# Patient Record
Sex: Male | Born: 1980 | Race: White | Hispanic: No | Marital: Single | Smoking: Current every day smoker
Health system: Southern US, Community
[De-identification: ages and names within clinical notes are randomized; demographics above are authoritative.]

## PROBLEM LIST (undated history)

## (undated) DIAGNOSIS — R569 Unspecified convulsions: Secondary | ICD-10-CM

## (undated) DIAGNOSIS — E669 Obesity, unspecified: Secondary | ICD-10-CM

## (undated) HISTORY — PX: ELBOW SURGERY: SHX618

## (undated) HISTORY — DX: Obesity, unspecified: E66.9

## (undated) HISTORY — PX: KNEE SURGERY: SHX244

---

## 2008-03-28 ENCOUNTER — Encounter: Admission: RE | Admit: 2008-03-28 | Discharge: 2008-03-28 | Payer: Self-pay | Admitting: Orthopedic Surgery

## 2008-04-02 ENCOUNTER — Ambulatory Visit (HOSPITAL_COMMUNITY): Admission: RE | Admit: 2008-04-02 | Discharge: 2008-04-03 | Payer: Self-pay | Admitting: Orthopedic Surgery

## 2010-02-20 ENCOUNTER — Emergency Department (HOSPITAL_BASED_OUTPATIENT_CLINIC_OR_DEPARTMENT_OTHER): Admission: EM | Admit: 2010-02-20 | Discharge: 2010-02-20 | Payer: Self-pay | Admitting: Emergency Medicine

## 2010-08-18 NOTE — Op Note (Signed)
NAMEMOHAMEDAMIN, NIFONG              ACCOUNT NO.:  [id number]   MEDICAL RECORD NO.:  [id number]          PATIENT TYPE:  AMB   LOCATION:  SDS                          FACILITY:  [facility]   PHYSICIAN:  Dionne Ano. Gramig, M.D.DATE OF BIRTH:  28-Sep-1980   DATE OF PROCEDURE:  04/02/2008  DATE OF DISCHARGE:                               OPERATIVE REPORT   PREOPERATIVE DIAGNOSIS:  Right elbow radial head fracture with lateral  ulnar collateral ligament injury.   POSTOPERATIVE DIAGNOSIS:  Right elbow radial head fracture with lateral  ulnar collateral ligament injury.   PROCEDURES:  1. Open reduction and internal fixation, right radial head fracture.  2. Stress radiography.  3. Lateral ulnar collateral ligament repair, right elbow.   SURGEON:  Dionne Ano. Amanda Pea, MD   ASSISTANT:  Karie Chimera, PA-C   ANESTHESIA:  General.   TOURNIQUET TIME:  Less than 1 hour.   ESTIMATED BLOOD LOSS:  Less than 50 mL.   INDICATIONS FOR PROCEDURE:  This patient is a pleasant 30 year old male  who presents with above-mentioned diagnosis.  His CT scan revealed  significant displacement.  Due to this, we recommended ORIF as he had  what appeared to be a mechanical block on his examination and 2.4-2.5 mm  of displacement on CT scan imaging.  He understood the risks and  benefits, bleeding, infection, heterotopic ossification, and other  risks.  With all issues in mind, he desired to proceed.  All questions  were encouraged and answered preoperatively.   OPERATIVE PROCEDURE:  The patient with Omnicef, anesthesia and taken to  operative suite where he underwent smooth induction of general  anesthesia.  A time-out was called.  Preoperative antibiotics were  given.  He was prepped and draped in the usual sterile fashion.  In the  OR after anesthesia was secured, the arm was evaluated.  He had a slight  bit of crepitance.  At this point in time, I performed a lateral  incision.  Kocher approach was made  between anconeus and the ECU very  carefully.  He did have some disruption of the lateral ulnar collateral  ligament complex.  I removed bloody hematoma fluid from the joint when  arthrotomy was performed and then performed a removal of clotted blood  and interposed synovial tissue.  The capitellum was intact.  There was  no significant scuffing.  The radial head was depressed.  I noted a 45%  depression.  I elevated this nicely with combination of Freer elevator  and following this placed a micro Acutrak screw which achieved excellent  fixation.  Following this, I checked the fixation.  He was stable, had  full arc of motion, and no complicating features.  Final copy of x-rays  were taken.   Following this, I x-rayed the wrist.  He was stable.  There was no  obvious ligamentous or bony derangement, however, one cannot absolutely  rule out an Essex-Lopresti injury.  Following this, the patient then  underwent repair of the lateral ulnar collateral ligament with #2  FiberWire.  He tolerated this well with no complicating features.  Following repair, stress  test looked well.  Final x-rays were taken and  the wound was closed with Vicryl followed by subcuticular Prolene.  Sensorcaine was placed as postop analgesia.  He tolerated the procedure  well with no complicating features.  He will be monitored in the  recovery room and discharged to home after a 23-hour observation if  things go well.  We will plan to begin interval range of motion at 10-14  days with splinting in our therapy department.  In addition to this, we  will not perform any strengthening until 8 weeks postop when the  fracture union is secured.  I have discussed with the __________etc.  All questions have been encouraged and answered.  It was a pleasure to  see him today and participate in his care.  He will remain on 23-hour  observation.  I have discussed all issues with the family.      Dionne Ano. Amanda Pea, M.D.   Electronically Signed     WMG/MEDQ  D:  04/02/2008  T:  04/03/2008  Job:  [id number]

## 2011-01-08 LAB — CBC
Hemoglobin: 15 g/dL (ref 13.0–17.0)
MCHC: 33.6 g/dL (ref 30.0–36.0)
RBC: 4.86 MIL/uL (ref 4.22–5.81)
WBC: 5.7 10*3/uL (ref 4.0–10.5)

## 2012-02-28 ENCOUNTER — Encounter (HOSPITAL_BASED_OUTPATIENT_CLINIC_OR_DEPARTMENT_OTHER): Payer: Self-pay

## 2012-02-28 ENCOUNTER — Emergency Department (HOSPITAL_BASED_OUTPATIENT_CLINIC_OR_DEPARTMENT_OTHER)
Admission: EM | Admit: 2012-02-28 | Discharge: 2012-02-28 | Disposition: A | Discharge status: Home / Self Care | Payer: Self-pay | Attending: Emergency Medicine | Admitting: Emergency Medicine

## 2012-02-28 DIAGNOSIS — Z79899 Other long term (current) drug therapy: Secondary | ICD-10-CM | POA: Insufficient documentation

## 2012-02-28 DIAGNOSIS — J029 Acute pharyngitis, unspecified: Secondary | ICD-10-CM | POA: Insufficient documentation

## 2012-02-28 DIAGNOSIS — R5381 Other malaise: Secondary | ICD-10-CM | POA: Insufficient documentation

## 2012-02-28 DIAGNOSIS — F17200 Nicotine dependence, unspecified, uncomplicated: Secondary | ICD-10-CM | POA: Insufficient documentation

## 2012-02-28 DIAGNOSIS — R5383 Other fatigue: Secondary | ICD-10-CM | POA: Insufficient documentation

## 2012-02-28 DIAGNOSIS — R059 Cough, unspecified: Secondary | ICD-10-CM | POA: Insufficient documentation

## 2012-02-28 DIAGNOSIS — R42 Dizziness and giddiness: Secondary | ICD-10-CM | POA: Insufficient documentation

## 2012-02-28 DIAGNOSIS — G40909 Epilepsy, unspecified, not intractable, without status epilepticus: Secondary | ICD-10-CM | POA: Insufficient documentation

## 2012-02-28 DIAGNOSIS — J3489 Other specified disorders of nose and nasal sinuses: Secondary | ICD-10-CM | POA: Insufficient documentation

## 2012-02-28 DIAGNOSIS — R05 Cough: Secondary | ICD-10-CM | POA: Insufficient documentation

## 2012-02-28 HISTORY — DX: Unspecified convulsions: R56.9

## 2012-02-28 LAB — RAPID STREP SCREEN (MED CTR MEBANE ONLY): Streptococcus, Group A Screen (Direct): NEGATIVE

## 2012-02-28 NOTE — ED Notes (Signed)
Pt reports fever of 102, cough and sore throat x 2 days unrelieved after taking OTC medications.

## 2012-02-28 NOTE — ED Provider Notes (Signed)
History     CSN: [id number]  Arrival date & time 02/28/12  6213   First MD Initiated Contact with Patient 02/28/12 0935      Chief Complaint  Patient presents with  . Fever  . Cough  . Sore Throat    (Consider location/radiation/quality/duration/timing/severity/associated sxs/prior treatment) HPI Comments: Patient presents with a three-day history of fever and sore throat. He had fever for the last 2 days associated with pain on swallowing. He did not have a fever today however. He's had a roommate sick with similar symptoms. He denies any nausea or vomiting. Denies any cough or chest congestion. He has a little bit of rhinorrhea. He denies any shortness of breath. Denies any skin rashes. She's been using over-the-counter medicines with some improvement of symptoms.  Patient is a 31 y.o. male presenting with fever, cough, and pharyngitis.  Fever Primary symptoms of the febrile illness include fever and fatigue. Primary symptoms do not include headaches, cough, shortness of breath, abdominal pain, nausea, vomiting, diarrhea, arthralgias or rash.  Cough Associated symptoms include rhinorrhea and sore throat. Pertinent negatives include no chest pain, no chills, no headaches and no shortness of breath.  Sore Throat Pertinent negatives include no chest pain, no abdominal pain, no headaches and no shortness of breath.    Past Medical History  Diagnosis Date  . Seizures     Past Surgical History  Procedure Date  . Elbow surgery   . Knee surgery     No family history on file.  History  Substance Use Topics  . Smoking status: Current Every Day Smoker -- 0.5 packs/day    Types: Cigarettes  . Smokeless tobacco: Not on file  . Alcohol Use: No      Review of Systems  Constitutional: Positive for fever and fatigue. Negative for chills and diaphoresis.  HENT: Positive for congestion, sore throat and rhinorrhea. Negative for sneezing and trouble swallowing.   Eyes: Negative.     Respiratory: Negative for cough, chest tightness and shortness of breath.   Cardiovascular: Negative for chest pain and leg swelling.  Gastrointestinal: Negative for nausea, vomiting, abdominal pain, diarrhea and blood in stool.  Genitourinary: Negative for frequency, hematuria, flank pain and difficulty urinating.  Musculoskeletal: Negative for back pain and arthralgias.  Skin: Negative for rash.  Neurological: Positive for dizziness. Negative for speech difficulty, weakness, numbness and headaches.    Allergies  Review of patient's allergies indicates no known allergies.  Home Medications   Current Outpatient Rx  Name  Route  Sig  Dispense  Refill  . CARBAMAZEPINE ER 100 MG PO CP12   Oral   Take 100 mg by mouth 2 (two) times daily.           BP 125/72  Pulse 80  Temp 97.9 F (36.6 C) (Oral)  Resp 20  Ht 5\' 10"  (1.778 m)  Wt 235 lb (106.595 kg)  BMI 33.72 kg/m2  SpO2 99%  Physical Exam  Constitutional: He is oriented to person, place, and time. He appears well-developed and well-nourished.  HENT:  Head: Normocephalic and atraumatic.  Eyes: Pupils are equal, round, and reactive to light.  Neck: Normal range of motion. Neck supple.       Mild erythema to the posterior pharynx. No exudates. Uvula is midline. No trismus.  Cardiovascular: Normal rate, regular rhythm and normal heart sounds.   Pulmonary/Chest: Effort normal and breath sounds normal. No respiratory distress. He has no wheezes. He has no rales. He exhibits no tenderness.  Abdominal: Soft. Bowel sounds are normal. There is no tenderness. There is no rebound and no guarding.  Musculoskeletal: Normal range of motion. He exhibits no edema.  Lymphadenopathy:    He has no cervical adenopathy.  Neurological: He is alert and oriented to person, place, and time.  Skin: Skin is warm and dry. No rash noted.  Psychiatric: He has a normal mood and affect.    ED Course  Procedures (including critical care  time)  Results for orders placed during the hospital encounter of 02/28/12  RAPID STREP SCREEN      Component Value Range   Streptococcus, Group A Screen (Direct) NEGATIVE  NEGATIVE   No results found.   No results found.   1. Pharyngitis       MDM  Pt well appearing, likely viral.  Advised symptomatic tx.        Rolan Bucco, MD 02/28/12 [phone]

## 2012-05-29 ENCOUNTER — Encounter (HOSPITAL_BASED_OUTPATIENT_CLINIC_OR_DEPARTMENT_OTHER): Payer: Self-pay | Admitting: *Deleted

## 2012-05-29 ENCOUNTER — Emergency Department (HOSPITAL_BASED_OUTPATIENT_CLINIC_OR_DEPARTMENT_OTHER)
Admission: EM | Admit: 2012-05-29 | Discharge: 2012-05-29 | Disposition: A | Discharge status: Home / Self Care | Payer: Self-pay | Attending: Emergency Medicine | Admitting: Emergency Medicine

## 2012-05-29 DIAGNOSIS — G40909 Epilepsy, unspecified, not intractable, without status epilepticus: Secondary | ICD-10-CM | POA: Insufficient documentation

## 2012-05-29 DIAGNOSIS — Z09 Encounter for follow-up examination after completed treatment for conditions other than malignant neoplasm: Secondary | ICD-10-CM | POA: Insufficient documentation

## 2012-05-29 DIAGNOSIS — F17200 Nicotine dependence, unspecified, uncomplicated: Secondary | ICD-10-CM | POA: Insufficient documentation

## 2012-05-29 DIAGNOSIS — Z79899 Other long term (current) drug therapy: Secondary | ICD-10-CM | POA: Insufficient documentation

## 2012-05-29 NOTE — ED Provider Notes (Signed)
History     CSN: [id number]  Arrival date & time 05/29/12  1520   None     Chief Complaint  Patient presents with  . Wound Check    (Consider location/radiation/quality/duration/timing/severity/associated sxs/prior treatment) HPI Comments: Pt reports he had an I and D a month ago.   Pt is concerned that packing grew over a wound.  No problems.   Just concerned  The history is provided by the patient. No language interpreter was used.    Past Medical History  Diagnosis Date  . Seizures     last seizure Dec 2013    Past Surgical History  Procedure Laterality Date  . Elbow surgery    . Knee surgery      No family history on file.  History  Substance Use Topics  . Smoking status: Current Every Day Smoker -- 0.50 packs/day    Types: Cigarettes  . Smokeless tobacco: Never Used  . Alcohol Use: No      Review of Systems  Gastrointestinal: Negative for abdominal pain.  All other systems reviewed and are negative.    Allergies  Review of patient's allergies indicates no known allergies.  Home Medications   Current Outpatient Rx  Name  Route  Sig  Dispense  Refill  . carbamazepine (CARBATROL) 100 MG 12 hr capsule   Oral   Take 100 mg by mouth 2 (two) times daily.           BP 115/65  Pulse 109  Temp(Src) 98.3 F (36.8 C) (Oral)  Resp 20  SpO2 96%  Physical Exam  Nursing note and vitals reviewed. Constitutional: He appears well-developed and well-nourished.  Abdominal: Soft. There is no tenderness.  Healed incision,   I am unable to palpate any foreign body.      ED Course  Procedures (including critical care time)  Labs Reviewed - No data to display No results found.   No diagnosis found.    MDM  Dr. Rhunette Croft and I looked with ultrasound,  No foreign body,   Pt advised follow up with general surgeon if any problems.   Pt advised he could have foreign body        Lonia Skinner Hopkins, [place] 05/29/12 1700

## 2012-05-29 NOTE — ED Notes (Signed)
Pt reports had abdominal abscess drained ~1 month ago at [facility] ED- states wound has closed over gauze

## 2012-05-30 NOTE — ED Provider Notes (Signed)
Medical screening examination/treatment/procedure(s) were conducted as a shared visit with non-physician practitioner(s) and myself.  I personally evaluated the patient during the encounter  Derwood Kaplan, MD 05/30/12 [phone]

## 2012-11-14 ENCOUNTER — Telehealth: Payer: Self-pay | Admitting: Nurse Practitioner

## 2012-11-16 NOTE — Telephone Encounter (Signed)
Patient was last seen in Sept 2013. He was suppose to get labs to check his blood levels at that time. He did not  And follow up visit was scheduled for March. Pt needs to be seen and have labs drawn.

## 2012-11-20 NOTE — Telephone Encounter (Signed)
I called and made appt for pt with CM/NP //Yan.  Last seen 12/2011.  RV for seizures.  No insurance.   Mom informed (may have $200.00) bill for visit.  Made appt for 11-22-12 at 1100.  She verbalized understanding.

## 2012-11-22 ENCOUNTER — Encounter: Payer: Self-pay | Admitting: Nurse Practitioner

## 2012-11-22 ENCOUNTER — Ambulatory Visit (INDEPENDENT_AMBULATORY_CARE_PROVIDER_SITE_OTHER): Payer: Self-pay | Admitting: Nurse Practitioner

## 2012-11-22 VITALS — BP 118/85 | HR 83 | Ht 70.0 in | Wt 246.0 lb

## 2012-11-22 DIAGNOSIS — G253 Myoclonus: Secondary | ICD-10-CM | POA: Insufficient documentation

## 2012-11-22 DIAGNOSIS — Z79899 Other long term (current) drug therapy: Secondary | ICD-10-CM

## 2012-11-22 DIAGNOSIS — G40309 Generalized idiopathic epilepsy and epileptic syndromes, not intractable, without status epilepticus: Secondary | ICD-10-CM

## 2012-11-22 NOTE — Progress Notes (Signed)
  Reason for visit follow up for seizure disorder HPI: Luis Alexander, 32 year old white male returns for followup. He was last seen in this office 12/14/11.  He he has a history of generalized seizure disorder and is currently on Carbatrol. He claims he  had a  seizures 2 weeks ago at work prior to that in April and went to the ER at [facility]. We do not have those records.  He denies missing any doses of his seizure meds.  He has had poor medical followup in the past. He did not have blood levels drawn after last visit as requested. He  does not have insurance. He has not had recent labs. He is accompanied by his mom today     ROS:  Negative except for 2 seizures since last seen  Medications Current Outpatient Prescriptions on File Prior to Visit  Medication Sig Dispense Refill  . carbamazepine (CARBATROL) 100 MG 12 hr capsule Take 100 mg by mouth 2 (two) times daily.       No current facility-administered medications on file prior to visit.    Allergies No Known Allergies  Physical Exam General: well developed, obese male  seated, in no evident distress   Neurologic Exam Mental Status: Awake and fully alert. Oriented to place and time. Follows all commands. Speech and language normal.   Cranial Nerves:  Pupils equal, briskly reactive to light. Extraocular movements full without nystagmus. Visual fields full to confrontation. Hearing intact and symmetric to finger snap. Facial sensation intact. Face, tongue, palate move normally and symmetrically. Neck flexion and extension normal.  Motor: Normal bulk and tone. Normal strength in all tested extremity muscles.No focal weakness Coordination: Rapid alternating movements normal in all extremities. Finger-to-nose and heel-to-shin performed accurately bilaterally. Gait and Station: Arises from chair without difficulty. Stance is normal. Gait demonstrates normal stride length and balance . Able to heel, toe and tandem walk without  difficulty.  Reflexes: 1+ and symmetric. Toes downgoing.     ASSESSMENT: Generalized seizure disorder with 2 seizures since last seen, one was 2 weeks ago. Patient gets his meds through patient assistance. No recent drug levels have been done     PLAN: Will check labs in the morning, bring your seizure medicine with you and  take after the lab draw, CBC, CMP, CBZ level. Renew meds once labs are back No driving until seizure free  for 6 months  Nilda Riggs, GNP-BC APRN

## 2012-11-22 NOTE — Patient Instructions (Addendum)
Will check labs in the morning, bring your seizure medicine with you and  take after the lab draw Renew meds once labs are back No driving until seizure free  for 6 months

## 2012-11-23 ENCOUNTER — Other Ambulatory Visit: Payer: Self-pay | Admitting: Nurse Practitioner

## 2012-11-27 ENCOUNTER — Telehealth: Payer: Self-pay | Admitting: Nurse Practitioner

## 2012-11-27 NOTE — Telephone Encounter (Signed)
Called patient and relayed cm has not received her lab work back. No one answered I will try another time.

## 2012-11-27 NOTE — Telephone Encounter (Signed)
Please let patient know I have not received his labs from 11/22/12, ordered CBC. CMP, TSH and depakote level

## 2012-11-27 NOTE — Telephone Encounter (Signed)
Please let patient know I have not received his CBC,CMP and CBZ level that was ordered last week

## 2012-11-29 ENCOUNTER — Telehealth: Payer: Self-pay | Admitting: Nurse Practitioner

## 2012-11-29 LAB — CBC WITH DIFFERENTIAL/PLATELET
Basophils Absolute: 0 10*3/uL (ref 0.0–0.2)
Basos: 0 % (ref 0–3)
Eos: 3 % (ref 0–5)
Eosinophils Absolute: 0.2 10*3/uL (ref 0.0–0.4)
HCT: 44.2 % (ref 37.5–51.0)
Hemoglobin: 15 g/dL (ref 12.6–17.7)
Immature Grans (Abs): 0 10*3/uL (ref 0.0–0.1)
Immature Granulocytes: 0 % (ref 0–2)
Lymphocytes Absolute: 2.2 10*3/uL (ref 0.7–3.1)
Lymphs: 33 % (ref 14–46)
MCH: 31.1 pg (ref 26.6–33.0)
MCHC: 33.9 g/dL (ref 31.5–35.7)
MCV: 92 fL (ref 79–97)
Monocytes Absolute: 0.5 10*3/uL (ref 0.1–0.9)
Monocytes: 8 % (ref 4–12)
Neutrophils Absolute: 3.7 10*3/uL (ref 1.4–7.0)
Neutrophils Relative %: 56 % (ref 40–74)
RBC: 4.83 x10E6/uL (ref 4.14–5.80)
RDW: 13.6 % (ref 12.3–15.4)
WBC: 6.6 10*3/uL (ref 3.4–10.8)

## 2012-11-29 LAB — COMPREHENSIVE METABOLIC PANEL
AST: 12 IU/L (ref 0–40)
Albumin/Globulin Ratio: 2.8 — ABNORMAL HIGH (ref 1.1–2.5)
Albumin: 4.7 g/dL (ref 3.5–5.5)
Alkaline Phosphatase: 78 IU/L (ref 39–117)
BUN/Creatinine Ratio: 17 (ref 8–19)
BUN: 14 mg/dL (ref 6–20)
CO2: 27 mmol/L (ref 18–29)
Creatinine, Ser: 0.84 mg/dL (ref 0.76–1.27)
GFR calc non Af Amer: 117 mL/min/{1.73_m2} (ref >59)
Globulin, Total: 1.7 g/dL (ref 1.5–4.5)
Sodium: 141 mmol/L (ref 134–144)

## 2012-11-29 LAB — CARBAMAZEPINE LEVEL, TOTAL

## 2012-11-29 NOTE — Telephone Encounter (Signed)
Done. Andrey Campanile  RN.

## 2012-11-29 NOTE — Telephone Encounter (Signed)
I called pt and relayed the results of labs.  (normal cbc and cmt).  CBZ level needs to be redrawn at the same facility where he had it drawn and there mistake of wrong tube.  Redrawn at no charge.  Trough level needed.  Pt aware.   If they have questions to have them call.

## 2012-12-05 ENCOUNTER — Other Ambulatory Visit: Payer: Self-pay | Admitting: Nurse Practitioner

## 2012-12-05 ENCOUNTER — Other Ambulatory Visit: Payer: Self-pay | Admitting: Neurology

## 2012-12-05 DIAGNOSIS — G40309 Generalized idiopathic epilepsy and epileptic syndromes, not intractable, without status epilepticus: Secondary | ICD-10-CM

## 2012-12-11 LAB — CARBAMAZEPINE LEVEL, TOTAL: Carbamazepine Lvl: 8.9 ug/mL (ref 4.0–12.0)

## 2012-12-12 NOTE — Progress Notes (Signed)
Quick Note:  Spoke to patient's mother and relayed normal labs, per [place]. ______

## 2013-02-12 ENCOUNTER — Telehealth: Payer: Self-pay | Admitting: Neurology

## 2013-02-12 NOTE — Telephone Encounter (Signed)
I called and left Vm. Please provide more information about what pharmacy to send refill to.

## 2013-02-27 ENCOUNTER — Telehealth: Payer: Self-pay

## 2013-02-27 NOTE — Telephone Encounter (Signed)
Mom called, left message saying [facility] needs a Rx for Carbatrol before they will ship meds.  I called them at [phone].  Spoke with Delerda (? on spelling).  She reviewed the patients case and said he was just approved for assistance, temporarily while ins is pending, and they will call today yo verify address for shipment.  [facility] they already had the Rx on file.  No further action is needed at this time.

## 2013-06-13 ENCOUNTER — Other Ambulatory Visit: Payer: Self-pay | Admitting: Neurology

## 2013-06-13 NOTE — Telephone Encounter (Signed)
Pharmacy only requests #[phone].  Patient waiting on mail order

## 2013-06-14 ENCOUNTER — Telehealth: Payer: Self-pay | Admitting: Neurology

## 2013-06-14 NOTE — Telephone Encounter (Signed)
Patient's mom calling to state that patient needs new script written for Carbatrol since he only has a few days left. She says that the script refill needs to be sent to [facility]. If questions, please call patient's mom back.

## 2013-06-14 NOTE — Telephone Encounter (Signed)
I called [facility] [employer].  Spoke with Fraser Din.  She said they already have an application on file which has refills, and they just need [place] to download and fax MD info portion on enrollment form to complete the request.  I went online, downloaded, printed and completed the form.  Faxed completed form to them at [phone].  They will expedite the refill request.

## 2013-09-24 ENCOUNTER — Emergency Department (HOSPITAL_BASED_OUTPATIENT_CLINIC_OR_DEPARTMENT_OTHER): Payer: Self-pay

## 2013-09-24 ENCOUNTER — Telehealth: Payer: Self-pay | Admitting: Neurology

## 2013-09-24 ENCOUNTER — Emergency Department (HOSPITAL_BASED_OUTPATIENT_CLINIC_OR_DEPARTMENT_OTHER)
Admission: EM | Admit: 2013-09-24 | Discharge: 2013-09-24 | Disposition: A | Discharge status: Home / Self Care | Payer: Self-pay | Attending: Emergency Medicine | Admitting: Emergency Medicine

## 2013-09-24 ENCOUNTER — Encounter (HOSPITAL_BASED_OUTPATIENT_CLINIC_OR_DEPARTMENT_OTHER): Payer: Self-pay | Admitting: Emergency Medicine

## 2013-09-24 DIAGNOSIS — R569 Unspecified convulsions: Secondary | ICD-10-CM

## 2013-09-24 DIAGNOSIS — R519 Headache, unspecified: Secondary | ICD-10-CM

## 2013-09-24 DIAGNOSIS — E669 Obesity, unspecified: Secondary | ICD-10-CM | POA: Insufficient documentation

## 2013-09-24 DIAGNOSIS — W2203XA Walked into furniture, initial encounter: Secondary | ICD-10-CM | POA: Insufficient documentation

## 2013-09-24 DIAGNOSIS — Y929 Unspecified place or not applicable: Secondary | ICD-10-CM | POA: Insufficient documentation

## 2013-09-24 DIAGNOSIS — Y9389 Activity, other specified: Secondary | ICD-10-CM | POA: Insufficient documentation

## 2013-09-24 DIAGNOSIS — R110 Nausea: Secondary | ICD-10-CM | POA: Insufficient documentation

## 2013-09-24 DIAGNOSIS — G40909 Epilepsy, unspecified, not intractable, without status epilepticus: Secondary | ICD-10-CM | POA: Insufficient documentation

## 2013-09-24 DIAGNOSIS — IMO0002 Reserved for concepts with insufficient information to code with codable children: Secondary | ICD-10-CM | POA: Insufficient documentation

## 2013-09-24 DIAGNOSIS — R51 Headache: Secondary | ICD-10-CM

## 2013-09-24 DIAGNOSIS — F17200 Nicotine dependence, unspecified, uncomplicated: Secondary | ICD-10-CM | POA: Insufficient documentation

## 2013-09-24 LAB — CARBAMAZEPINE LEVEL, TOTAL: Carbamazepine Lvl: 11 ug/mL (ref 4.0–12.0)

## 2013-09-24 NOTE — ED Provider Notes (Signed)
CSN: [id number]     Arrival date & time 09/24/13  0935 History   First MD Initiated Contact with Patient 09/24/13 0940     Chief Complaint  Patient presents with  . Dizziness     (Consider location/radiation/quality/duration/timing/severity/associated sxs/prior Treatment) Patient is a 33 y.o. male presenting with dizziness.  Dizziness Associated symptoms: headaches and nausea   Associated symptoms: no vomiting    33 year old male with history of seizure disorder, followed as an outpatient by neurology and currently taking carbamazepine, who presents with headache and lightheadedness after having a seizure 4 days ago. Patient states that he had a seizure on Friday, and believes he hit his head on the coffee table. He has had pain in the back of his head since that time, has also had a frontal headache. Has felt foggy headed intermittently, and lightheaded. No vertiginous symptoms. Endorses having some tingling in the back of his head. No distinct numbness tingling or weakness elsewhere in his body that he reports. He did have some soreness of his left arm, which has resolved. Has not had any speech problems. States he has not missed any doses of his carbamazepine. His next urology appointment is in August. Has had some nausea, but no vomiting, no abdominal pain or diarrhea. Does endorse some blurry vision. Has been eating and drinking well. His normal triggers for seizures are being tired or stressed. He states he has a seizure once every several months. He did bite his tongue on Friday, but was not incontinent of urine or feces.  Past Medical History  Diagnosis Date  . Seizures     last seizure Dec 2013  . Obesity    Past Surgical History  Procedure Laterality Date  . Elbow surgery    . Knee surgery     Family History  Problem Relation Age of Onset  . Cancer Father    History  Substance Use Topics  . Smoking status: Current Every Day Smoker -- 0.50 packs/day    Types: Cigarettes   . Smokeless tobacco: Never Used  . Alcohol Use: No    Review of Systems  Constitutional: Negative for fever.  Gastrointestinal: Positive for nausea. Negative for vomiting and abdominal pain.  Neurological: Positive for dizziness, seizures, light-headedness and headaches. Negative for numbness.  All other systems reviewed and are negative.     Allergies  Review of patient's allergies indicates no known allergies.  Home Medications   Prior to Admission medications   Medication Sig Start Date End Date Taking? Authorizing Majesta Leichter  CARBATROL 200 MG 12 hr capsule Take 2 capsules by mouth every morning, and 3 capsules at bedtime 06/13/13   Marcial Pacas, MD   BP 138/79  Pulse 87  Temp(Src) 98 F (36.7 C) (Oral)  Resp 18  Ht 5\' 10"  (1.778 m)  Wt 267 lb 3.2 oz (121.201 kg)  BMI 38.34 kg/m2  SpO2 98% Physical Exam  Constitutional: He is oriented to person, place, and time. He appears well-developed and well-nourished. No distress.  HENT:  Head: Normocephalic.  Mouth/Throat: Oropharynx is clear and moist.  Mild abrasion to posterior mid occiput  Eyes: EOM are normal. Pupils are equal, round, and reactive to light.  Neck: Normal range of motion. Neck supple.  Cardiovascular: Normal rate, regular rhythm and normal heart sounds.   No murmur heard. Pulmonary/Chest: Effort normal and breath sounds normal. No respiratory distress. He has no wheezes. He has no rales.  Abdominal: Soft. Bowel sounds are normal. He exhibits no distension and  no mass. There is no tenderness. There is no rebound and no guarding.  Musculoskeletal: He exhibits no edema.  Lymphadenopathy:    He has no cervical adenopathy.  Neurological: He is alert and oriented to person, place, and time. He has normal strength. He displays no tremor. He displays no seizure activity. Gait normal. GCS eye subscore is 4. GCS verbal subscore is 5. GCS motor subscore is 6.  Face symmetric. EOMI. PERRL. FNF testing normal. Full  strength all four extremities. Decreased sensation to light touch on R face and R arm.  Skin: Skin is warm and dry. No rash noted. He is not diaphoretic. No erythema.  Psychiatric: He has a normal mood and affect. His behavior is normal.    ED Course  Procedures (including critical care time) Labs Review Labs Reviewed  CARBAMAZEPINE LEVEL, TOTAL    Imaging Review Ct Head Wo Contrast  09/24/2013   CLINICAL DATA:  Seizure.  EXAM: CT HEAD WITHOUT CONTRAST  TECHNIQUE: Contiguous axial images were obtained from the base of the skull through the vertex without intravenous contrast.  COMPARISON:  None.  FINDINGS: No mass. No hydrocephalus. No hemorrhage. No acute bony abnormality. Mucosal thickening in the ethmoidal and sphenoid sinuses projection prominent in the right aspect of the sphenoid sinus. Severe streak artifact is present.  IMPRESSION: 1. Sphenoid sinusitis . 2. No acute intracranial abnormality otherwise noted.   Electronically Signed   By: Marcello Moores  Register   On: 09/24/2013 10:32     EKG Interpretation None      MDM   Final diagnoses:  Seizure  Headache, unspecified headache type    33 year old male with known seizure disorder, presenting with headache, lightheadedness, and decreased sensation to light touch over right face and arm in setting of recent seizure with possible head trauma. Will check carbamazepine level and obtain CT head.  Update: Carbamazepine level normal. CT head without any acute findings. Patient is stable for discharge to home. He's been instructed to followup with his neurologist sooner than August, due to his breakthrough seizure. Reiterated to patient importance of not driving.  Chrisandra Netters, MD Family Medicine PGY-2   Leeanne Rio, MD 09/24/13 [phone]

## 2013-09-24 NOTE — Discharge Instructions (Signed)
You were seen today for a headache after a seizure. Your CT scan of your head did not show any problems. Your Carbatrol level was normal. Take tylenol as needed for headache. Follow up with your neurologist within 2-3 weeks due to having had a seizure. Continue your seizure medicine at its current dose.  General Headache Without Cause A general headache is pain or discomfort felt around the head or neck area. The cause may not be found.  HOME CARE   Keep all doctor visits.  Only take medicines as told by your doctor.  Lie down in a dark, quiet room when you have a headache.  Keep a journal to find out if certain things bring on headaches. For example, write down:  What you eat and drink.  How much sleep you get.  Any change to your diet or medicines.  Relax by getting a massage or doing other relaxing activities.  Put ice or heat packs on the head and neck area as told by your doctor.  Lessen stress.  Sit up straight. Do not tighten (tense) your muscles.  Quit smoking if you smoke.  Lessen how much alcohol you drink.  Lessen how much caffeine you drink, or stop drinking caffeine.  Eat and sleep on a regular schedule.  Get 7 to 9 hours of sleep, or as told by your doctor.  Keep lights dim if bright lights bother you or make your headaches worse. GET HELP RIGHT AWAY IF:   Your headache becomes really bad.  You have a fever.  You have a stiff neck.  You have trouble seeing.  Your muscles are weak, or you lose muscle control.  You lose your balance or have trouble walking.  You feel like you will pass out (faint), or you pass out.  You have really bad symptoms that are different than your first symptoms.  You have problems with the medicines given to you by your doctor.  Your medicines do not work.  Your headache feels different than the other headaches.  You feel sick to your stomach (nauseous) or throw up (vomit). MAKE SURE YOU:   Understand these  instructions.  Will watch your condition.  Will get help right away if you are not doing well or get worse. Document Released: 12/30/2007 Document Revised: 06/14/2011 Document Reviewed: 03/12/2011 [facility] Patient Information 2015 [place], [place]. This information is not intended to replace advice given to you by your health care provider. Make sure you discuss any questions you have with your health care provider.

## 2013-09-24 NOTE — Telephone Encounter (Signed)
Patient's mother calling--patient has an appointment in August but had a seizure last Friday and hit his head-patient was seen in the Emergency Room and was told he had a concussion-patient is still having headaches and is dizzy--needs soon appointment--please call.

## 2013-09-24 NOTE — ED Notes (Signed)
Pt reports he had a seizure on Saturday and hit his head on a table and has felt dizzy ever since. Did not call his neurologist.

## 2013-09-25 NOTE — ED Provider Notes (Signed)
I saw and evaluated the patient, reviewed the resident's note and I agree with the findings and plan.   .Face to face Exam:  General:  Awake HEENT:  Atraumatic Resp:  Normal effort Abd:  Nondistended Neuro:No focal weakness  Dot Lanes, MD 09/25/13 1544

## 2013-09-25 NOTE — Telephone Encounter (Signed)
Pt's mother Hilda Blades calling stating that pt had a seizure last Friday and hit his head and went to the ER and was told pt has a concussion. Mother states that pt is still having headaches and dizziness. Pt last OV was 11/22/12 and pt has an yearly appt on 11/22/13. Mother would like for the pt to be seen earlier. Please advise

## 2013-09-25 NOTE — Telephone Encounter (Signed)
Mother calling and hadn't received return call.  Please return call and advise

## 2013-09-25 NOTE — Telephone Encounter (Signed)
Please schedule appt in 2 to 3 weeks

## 2013-09-26 NOTE — Telephone Encounter (Signed)
Patient is scheduled for 09-28-13 with cm. Patient head is still hurting and mom did not want to wait 2-3 weeks.

## 2013-09-28 ENCOUNTER — Ambulatory Visit (INDEPENDENT_AMBULATORY_CARE_PROVIDER_SITE_OTHER): Payer: Self-pay | Admitting: Nurse Practitioner

## 2013-09-28 ENCOUNTER — Encounter: Payer: Self-pay | Admitting: Nurse Practitioner

## 2013-09-28 VITALS — BP 125/84 | HR 81 | Ht 70.0 in | Wt 268.0 lb

## 2013-09-28 DIAGNOSIS — G253 Myoclonus: Secondary | ICD-10-CM

## 2013-09-28 DIAGNOSIS — G40309 Generalized idiopathic epilepsy and epileptic syndromes, not intractable, without status epilepticus: Secondary | ICD-10-CM

## 2013-09-28 DIAGNOSIS — Z5181 Encounter for therapeutic drug level monitoring: Secondary | ICD-10-CM

## 2013-09-28 DIAGNOSIS — R51 Headache: Secondary | ICD-10-CM

## 2013-09-28 DIAGNOSIS — R519 Headache, unspecified: Secondary | ICD-10-CM | POA: Insufficient documentation

## 2013-09-28 MED ORDER — TOPIRAMATE 25 MG PO TABS: ORAL_TABLET | ORAL | Status: DC

## 2013-09-28 NOTE — Patient Instructions (Signed)
Continue Carbatrol at current dose Topamax 25 mg daily for one week then 2 at night  CBC, CMP today Followup in 4 months

## 2013-09-28 NOTE — Progress Notes (Signed)
[facility]  PATIENT: Luis Alexander DOB: [birth year]   REASON FOR VISIT: Followup for seizure disorder.   HISTORY OF PRESENT ILLNESS: Luis Alexander, 33 year old male returns for followup. He has a history of generalized seizure disorder and is currently on Carbatrol. He was last seen in this office 11/22/2012. He was in the emergency room on June 22 after having a seizure and hitting his head. CT of the head was normal. Carbamazepine level was 11. He denies missing any doses of his seizure meds. He has had poor medical followup in the past. He claims he had a seizure 2 months ago did not call our office. He does not have insurance. He has not had recent labs. He is accompanied by his mom today. He gets his Carbatrol through patient assistance. He returns for reevaluation. He is taking Tylenol for headache    REVIEW OF SYSTEMS: Full 14 system review of systems performed and notable only for those listed, all others are neg:  Constitutional: N/A  Cardiovascular: N/A  Ear/Nose/Throat: N/A  Skin: N/A  Eyes: Sensitivity to light  Respiratory: N/A  Gastroitestinal: N/A  Hematology/Lymphatic: N/A  Endocrine: N/A Musculoskeletal:N/A  Allergy/Immunology: N/A  Neurological: Dizziness, headache, seizure  Psychiatric: N/A Sleep : NA   ALLERGIES: No Known Allergies  HOME MEDICATIONS: Outpatient Prescriptions Prior to Visit  Medication Sig Dispense Refill  . CARBATROL 200 MG 12 hr capsule Take 2 capsules by mouth every morning, and 3 capsules at bedtime  20 capsule  2   No facility-administered medications prior to visit.    PAST MEDICAL HISTORY: Past Medical History  Diagnosis Date  . Seizures     last seizure Dec 2013  . Obesity     PAST SURGICAL HISTORY: Past Surgical History  Procedure Laterality Date  . Elbow surgery    . Knee surgery      FAMILY HISTORY: Family History  Problem Relation Age of Onset  . Cancer Father     SOCIAL HISTORY: History    Social History  . Marital Status: Single    Spouse Name: N/A    Number of Children: 0  . Years of Education: 12   Occupational History  . [employer]    Social History Main Topics  . Smoking status: Current Every Day Smoker -- 0.50 packs/day    Types: Cigarettes  . Smokeless tobacco: Never Used  . Alcohol Use: No  . Drug Use: No  . Sexual Activity: Not on file   Other Topics Concern  . Not on file   Social History Narrative   Patient is single with no children   Patient is left handed   Patient has a high school education   Patient drinks 4 cups daily           PHYSICAL EXAM  Filed Vitals:   09/28/13 1527  BP: 125/84  Pulse: 81  Height: 5\' 10"  (1.778 m)  Weight: 268 lb (121.564 kg)   Body mass index is 38.45 kg/(m^2). General: well developed, obese male seated, in no evident distress  Neurologic Exam  Mental Status: Awake and fully alert. Oriented to place and time. Follows all commands. Speech and language normal.  Cranial Nerves: Pupils equal, briskly reactive to light. Extraocular movements full without nystagmus. Visual fields full to confrontation. Hearing intact and symmetric to finger snap. Facial sensation intact. Face, tongue, palate move normally and symmetrically. Neck flexion and extension normal.  Motor: Normal bulk and tone. Normal strength in all tested extremity muscles.No focal  weakness Sensory : Decreased pinprick on the right as compared to the left, soft touch and vibratory normal Coordination: Rapid alternating movements normal in all extremities. Finger-to-nose and heel-to-shin performed accurately bilaterally.  Gait and Station: Arises from chair without difficulty. Stance is normal. Gait demonstrates normal stride length and balance . Able to heel, toe and tandem walk without difficulty.  Reflexes: 1+ and symmetric. Toes downgoing.   DIAGNOSTIC DATA (LABS, IMAGING, TESTING) - I reviewed patient records, labs, notes, testing and imaging myself  where available.  Lab Results  Component Value Date   WBC 6.6 11/23/2012   HGB 15.0 11/23/2012   HCT 44.2 11/23/2012   MCV 92 11/23/2012   PLT 247 04/02/2008      Component Value Date/Time   NA 141 11/23/2012 0000   K 4.7 11/23/2012 0000   CL 102 11/23/2012 0000   CO2 27 11/23/2012 0000   GLUCOSE 93 11/23/2012 0000   BUN 14 11/23/2012 0000   CREATININE 0.84 11/23/2012 0000   CALCIUM 9.8 11/23/2012 0000   PROT 6.4 11/23/2012 0000   AST 12 11/23/2012 0000   ALT 25 11/23/2012 0000   ALKPHOS 78 11/23/2012 0000   BILITOT 0.3 11/23/2012 0000   GFRNONAA 117 11/23/2012 0000   GFRAA 136 11/23/2012 0000       ASSESSMENT AND PLAN  33 y.o. year old male  has a past medical history of Seizures and Obesity. here to followup. Recent seizure and patient hit his head. CT of the head was normal. He has continued to have headache after the seizure. He complains of some light sensitivity. ER record  reviewed  Discussed with Dr. Krista Blue Continue Carbatrol at current dose Topamax 25 mg daily for one week then 2 at night , patient made aware of s/e, no hx of renal stones CBC, CMP today Followup in 4 months, no driving Luis Alexander, [facility], [facility], APRN  [facility] [address], [place], [place] [address] [phone]

## 2013-09-29 LAB — COMPREHENSIVE METABOLIC PANEL
ALK PHOS: 77 IU/L (ref 39–117)
ALT: 26 IU/L (ref 0–44)
AST: 16 IU/L (ref 0–40)
Albumin/Globulin Ratio: 2 (ref 1.1–2.5)
Albumin: 4.8 g/dL (ref 3.5–5.5)
BUN/Creatinine Ratio: 19 (ref 8–19)
BUN: 16 mg/dL (ref 6–20)
CALCIUM: 10.2 mg/dL (ref 8.7–10.2)
CHLORIDE: 101 mmol/L (ref 97–108)
CO2: 26 mmol/L (ref 18–29)
CREATININE: 0.83 mg/dL (ref 0.76–1.27)
GFR calc Af Amer: 136 mL/min/{1.73_m2} (ref >59)
GFR calc non Af Amer: 117 mL/min/{1.73_m2} (ref >59)
Globulin, Total: 2.4 g/dL (ref 1.5–4.5)
Glucose: 86 mg/dL (ref 65–99)
POTASSIUM: 5.1 mmol/L (ref 3.5–5.2)
SODIUM: 142 mmol/L (ref 134–144)
Total Bilirubin: 0.2 mg/dL (ref 0.0–1.2)
Total Protein: 7.2 g/dL (ref 6.0–8.5)

## 2013-09-29 LAB — CBC WITH DIFFERENTIAL/PLATELET
BASOS ABS: 0 10*3/uL (ref 0.0–0.2)
Basos: 0 %
EOS ABS: 0.2 10*3/uL (ref 0.0–0.4)
EOS: 2 %
HCT: 44.3 % (ref 37.5–51.0)
Hemoglobin: 14.9 g/dL (ref 12.6–17.7)
IMMATURE GRANS (ABS): 0 10*3/uL (ref 0.0–0.1)
IMMATURE GRANULOCYTES: 0 %
Lymphocytes Absolute: 2.8 10*3/uL (ref 0.7–3.1)
Lymphs: 28 %
MCH: 31.6 pg (ref 26.6–33.0)
MCHC: 33.6 g/dL (ref 31.5–35.7)
MCV: 94 fL (ref 79–97)
MONOS ABS: 0.9 10*3/uL (ref 0.1–0.9)
Monocytes: 9 %
NEUTROS PCT: 61 %
Neutrophils Absolute: 6 10*3/uL (ref 1.4–7.0)
RBC: 4.72 x10E6/uL (ref 4.14–5.80)
RDW: 12.8 % (ref 12.3–15.4)
WBC: 9.9 10*3/uL (ref 3.4–10.8)

## 2013-10-02 ENCOUNTER — Encounter: Payer: Self-pay | Admitting: *Deleted

## 2013-10-02 NOTE — Progress Notes (Signed)
Quick Note:  Mailed letter with results, since I was not able to contact or leave message thru his phone number. ______

## 2013-10-24 ENCOUNTER — Telehealth: Payer: Self-pay | Admitting: *Deleted

## 2013-10-24 NOTE — Telephone Encounter (Signed)
Called pt and could not leave message because voice mail is not set up.

## 2013-10-25 MED ORDER — TOPIRAMATE 25 MG PO TABS: 50.0000 mg | ORAL_TABLET | Freq: Two times a day (BID) | ORAL | Status: DC

## 2013-10-25 NOTE — Telephone Encounter (Signed)
I called pt and relayed the message below, increase topamax to 50mg  po bid.  New rx sent to [facility] in [place] with new directions.  He asked about being outside in heat  and taking medication.   I relayed stay hydrated.

## 2013-10-25 NOTE — Telephone Encounter (Signed)
Could you please clarify his dose of Topamax. I only ordered to be at 50mg  at night

## 2013-10-25 NOTE — Telephone Encounter (Addendum)
Pt returned call, I spoke with him and he is taking hi carbatrol 400mg  po am and 600mg  po pm, as well as new med, topamax 50mg  po bid for the las 3 wks.  He had seizure (grand mal, similar presentation as other seizures, felt like it was a strong one.  I relayed that we had tried to contact him and hhis VM not set up.  He has new phone and does not know how to set up.  I told him to look for a call after 1600 today, [phone].  He asked if the new med was in his system yet?  I relayed thought that after 3wks would be.

## 2013-10-25 NOTE — Telephone Encounter (Signed)
Yes, this is topamax 50mg  po qhs.

## 2013-10-25 NOTE — Telephone Encounter (Signed)
I attempted to call patient. His voice mail is not set up. He can increase his Topamax by 50mg  so take 50mg  am and 50mg  pm. Please let patient know I called in new Rx to his drug store. Stay on carbatrol as directed.

## 2013-11-06 ENCOUNTER — Encounter (HOSPITAL_BASED_OUTPATIENT_CLINIC_OR_DEPARTMENT_OTHER): Payer: Self-pay | Admitting: Emergency Medicine

## 2013-11-06 ENCOUNTER — Emergency Department (HOSPITAL_BASED_OUTPATIENT_CLINIC_OR_DEPARTMENT_OTHER)
Admission: EM | Admit: 2013-11-06 | Discharge: 2013-11-06 | Disposition: A | Discharge status: Home / Self Care | Payer: Self-pay | Attending: Emergency Medicine | Admitting: Emergency Medicine

## 2013-11-06 DIAGNOSIS — F17200 Nicotine dependence, unspecified, uncomplicated: Secondary | ICD-10-CM | POA: Insufficient documentation

## 2013-11-06 DIAGNOSIS — Z79899 Other long term (current) drug therapy: Secondary | ICD-10-CM | POA: Insufficient documentation

## 2013-11-06 DIAGNOSIS — K3189 Other diseases of stomach and duodenum: Secondary | ICD-10-CM | POA: Insufficient documentation

## 2013-11-06 DIAGNOSIS — L723 Sebaceous cyst: Secondary | ICD-10-CM | POA: Insufficient documentation

## 2013-11-06 DIAGNOSIS — R1013 Epigastric pain: Secondary | ICD-10-CM | POA: Insufficient documentation

## 2013-11-06 DIAGNOSIS — E669 Obesity, unspecified: Secondary | ICD-10-CM | POA: Insufficient documentation

## 2013-11-06 DIAGNOSIS — G40909 Epilepsy, unspecified, not intractable, without status epilepticus: Secondary | ICD-10-CM | POA: Insufficient documentation

## 2013-11-06 LAB — CBC WITH DIFFERENTIAL/PLATELET
BASOS PCT: 0 % (ref 0–1)
Basophils Absolute: 0 10*3/uL (ref 0.0–0.1)
Eosinophils Absolute: 0.2 10*3/uL (ref 0.0–0.7)
Eosinophils Relative: 3 % (ref 0–5)
HCT: 42.7 % (ref 39.0–52.0)
HEMOGLOBIN: 14.4 g/dL (ref 13.0–17.0)
LYMPHS ABS: 2.1 10*3/uL (ref 0.7–4.0)
Lymphocytes Relative: 29 % (ref 12–46)
MCH: 31.8 pg (ref 26.0–34.0)
MCHC: 33.7 g/dL (ref 30.0–36.0)
MCV: 94.3 fL (ref 78.0–100.0)
MONOS PCT: 9 % (ref 3–12)
Monocytes Absolute: 0.7 10*3/uL (ref 0.1–1.0)
NEUTROS ABS: 4.3 10*3/uL (ref 1.7–7.7)
NEUTROS PCT: 59 % (ref 43–77)
PLATELETS: 223 10*3/uL (ref 150–400)
RBC: 4.53 MIL/uL (ref 4.22–5.81)
RDW: 12.6 % (ref 11.5–15.5)
WBC: 7.4 10*3/uL (ref 4.0–10.5)

## 2013-11-06 LAB — COMPREHENSIVE METABOLIC PANEL
ALBUMIN: 4.2 g/dL (ref 3.5–5.2)
ALK PHOS: 79 U/L (ref 39–117)
ALT: 42 U/L (ref 0–53)
ANION GAP: 17 — AB (ref 5–15)
AST: 24 U/L (ref 0–37)
BILIRUBIN TOTAL: 0.3 mg/dL (ref 0.3–1.2)
BUN: 17 mg/dL (ref 6–23)
CHLORIDE: 105 meq/L (ref 96–112)
CO2: 20 mEq/L (ref 19–32)
Calcium: 9.4 mg/dL (ref 8.4–10.5)
Creatinine, Ser: 0.9 mg/dL (ref 0.50–1.35)
GFR calc Af Amer: >90 mL/min (ref >90)
GFR calc non Af Amer: >90 mL/min (ref >90)
GLUCOSE: 107 mg/dL — AB (ref 70–99)
POTASSIUM: 4 meq/L (ref 3.7–5.3)
SODIUM: 142 meq/L (ref 137–147)
Total Protein: 7.2 g/dL (ref 6.0–8.3)

## 2013-11-06 LAB — LIPASE, BLOOD: Lipase: 41 U/L (ref 11–59)

## 2013-11-06 MED ORDER — OMEPRAZOLE 20 MG PO CPDR: DELAYED_RELEASE_CAPSULE | ORAL | Status: DC

## 2013-11-06 MED ORDER — GI COCKTAIL ~~LOC~~
30.0000 mL | Freq: Once | ORAL | Status: AC
  Administered 2013-11-06: 30 mL via ORAL
  Filled 2013-11-06: qty 30

## 2013-11-06 NOTE — ED Notes (Signed)
PA at bedside for evalualtion

## 2013-11-06 NOTE — ED Provider Notes (Signed)
CSN: [id number]     Arrival date & time 11/06/13  1216 History   First MD Initiated Contact with Patient 11/06/13 1220     Chief Complaint  Patient presents with  . Abdominal Pain     (Consider location/radiation/quality/duration/timing/severity/associated sxs/prior Treatment) Patient is a 33 y.o. male presenting with abdominal pain and abscess. The history is provided by the patient. No language interpreter was used.  Abdominal Pain Pain location:  Epigastric Pain quality: sharp   Pain radiates to:  RUQ and LUQ Duration:  2 weeks Associated symptoms: diarrhea   Associated symptoms: no chills, no cough, no fever, no nausea, no shortness of breath and no vomiting   Associated symptoms comment:  Epigastric pain that is sharp, lasting throughout the day, some worse with lying down, not noticeably affected by food. He denies fever. He has noticed some increased belching and reports multiple loose bowel movements that are non-bloody.  Abscess Location:  Torso Abscess quality: not draining   Duration:  6 months Associated symptoms: no fever, no nausea and no vomiting   Associated symptoms comment:  He complains of a knot in the central lower chest wall that he reports has had to be "opened" in the past. Symptoms started reoccurring 6 months ago and have not been associated with redness, significant pain or drainage.    Past Medical History  Diagnosis Date  . Seizures     last seizure Dec 2013  . Obesity    Past Surgical History  Procedure Laterality Date  . Elbow surgery    . Knee surgery     Family History  Problem Relation Age of Onset  . Cancer Father    History  Substance Use Topics  . Smoking status: Current Every Day Smoker -- 0.50 packs/day    Types: Cigarettes  . Smokeless tobacco: Never Used  . Alcohol Use: No    Review of Systems  Constitutional: Negative for fever and chills.  Respiratory: Negative.  Negative for cough and shortness of breath.    Cardiovascular: Negative.   Gastrointestinal: Positive for abdominal pain and diarrhea. Negative for nausea and vomiting.  Musculoskeletal: Negative.   Skin:       See HPI.  Neurological: Negative.       Allergies  Review of patient's allergies indicates no known allergies.  Home Medications   Prior to Admission medications   Medication Sig Start Date End Date Taking? Authorizing Provider  CARBATROL 200 MG 12 hr capsule Take 2 capsules by mouth every morning, and 3 capsules at bedtime 06/13/13   Marcial Pacas, MD  omeprazole (PRILOSEC) 20 MG capsule Take one tablet twice daily for 5 days then once daily after that 11/06/13   Nehemiah Settle A Kennidee Heyne, PA-C  topiramate (TOPAMAX) 25 MG tablet Take 2 tablets (50 mg total) by mouth 2 (two) times daily. 10/25/13   Dennie Bible, NP   BP 123/82  Pulse 72  Temp(Src) 98.8 F (37.1 C) (Oral)  Resp 18  Ht 5\' 10"  (1.778 m)  Wt 250 lb (113.399 kg)  BMI 35.87 kg/m2  SpO2 99% Physical Exam  Constitutional: He is oriented to person, place, and time. He appears well-developed and well-nourished.  HENT:  Head: Normocephalic.  Neck: Normal range of motion. Neck supple.  Cardiovascular: Normal rate and regular rhythm.   Pulmonary/Chest: Effort normal and breath sounds normal.  Abdominal: Soft. Bowel sounds are normal. There is tenderness. There is no rebound and no guarding.  Epigastric tenderness. No RUQ or specific LUQ  tenderness. Non-distended abdomen.   Musculoskeletal: Normal range of motion.  Neurological: He is alert and oriented to person, place, and time.  Skin: Skin is warm and dry. No rash noted.  Small, firm, non-fluctuant cyst over xiphoid process that is minimally tender.   Psychiatric: He has a normal mood and affect.    ED Course  Procedures (including critical care time) Labs Review Labs Reviewed  COMPREHENSIVE METABOLIC PANEL - Abnormal; Notable for the following:    Glucose, Bld 107 (*)    Anion gap 17 (*)    All other  components within normal limits  CBC WITH DIFFERENTIAL  LIPASE, BLOOD    Imaging Review No results found.   EKG Interpretation None      MDM   Final diagnoses:  Epigastric pain  Dyspepsia  Sebaceous cyst    Cyst on lower chest wall present for 6 months and recurrent. No redness or significant tenderness. Suspect sebaceous cyst. No further management required. Abdominal pain likely GI - negative blood studies, some improvement with GI cocktail. No RUQ tenderness to cause suspicion of gall bladder problems. Stable for discharge.     Dewaine Oats, PA-C 11/09/13 0201

## 2013-11-06 NOTE — Discharge Instructions (Signed)
Abdominal Pain Many things can cause abdominal pain. Usually, abdominal pain is not caused by a disease and will improve without treatment. It can often be observed and treated at home. Your health care provider will do a physical exam and possibly order blood tests and X-rays to help determine the seriousness of your pain. However, in many cases, more time must pass before a clear cause of the pain can be found. Before that point, your health care provider may not know if you need more testing or further treatment. HOME CARE INSTRUCTIONS  Monitor your abdominal pain for any changes. The following actions may help to alleviate any discomfort you are experiencing:  Only take over-the-counter or prescription medicines as directed by your health care provider.  Do not take laxatives unless directed to do so by your health care provider.  Try a clear liquid diet (broth, tea, or water) as directed by your health care provider. Slowly move to a bland diet as tolerated. SEEK MEDICAL CARE IF:  You have unexplained abdominal pain.  You have abdominal pain associated with nausea or diarrhea.  You have pain when you urinate or have a bowel movement.  You experience abdominal pain that wakes you in the night.  You have abdominal pain that is worsened or improved by eating food.  You have abdominal pain that is worsened with eating fatty foods.  You have a fever. SEEK IMMEDIATE MEDICAL CARE IF:   Your pain does not go away within 2 hours.  You keep throwing up (vomiting).  Your pain is felt only in portions of the abdomen, such as the right side or the left lower portion of the abdomen.  You pass bloody or black tarry stools. MAKE SURE YOU:  Understand these instructions.   Will watch your condition.   Will get help right away if you are not doing well or get worse.  Document Released: 12/30/2004 Document Revised: 03/27/2013 Document Reviewed: 11/29/2012 [facility] Patient Information  2015 [place], [place]. This information is not intended to replace advice given to you by your health care provider. Make sure you discuss any questions you have with your health care provider. Epidermal Cyst An epidermal cyst is sometimes called a sebaceous cyst, epidermal inclusion cyst, or infundibular cyst. These cysts usually contain a substance that looks "pasty" or "cheesy" and may have a bad smell. This substance is a protein called keratin. Epidermal cysts are usually found on the face, neck, or trunk. They may also occur in the vaginal area or other parts of the genitalia of both men and women. Epidermal cysts are usually small, painless, slow-growing bumps or lumps that move freely under the skin. It is important not to try to pop them. This may cause an infection and lead to tenderness and swelling. CAUSES  Epidermal cysts may be caused by a deep penetrating injury to the skin or a plugged hair follicle, often associated with acne. SYMPTOMS  Epidermal cysts can become inflamed and cause:  Redness.  Tenderness.  Increased temperature of the skin over the bumps or lumps.  Grayish-white, bad smelling material that drains from the bump or lump. DIAGNOSIS  Epidermal cysts are easily diagnosed by your caregiver during an exam. Rarely, a tissue sample (biopsy) may be taken to rule out other conditions that may resemble epidermal cysts. TREATMENT   Epidermal cysts often get better and disappear on their own. They are rarely ever cancerous.  If a cyst becomes infected, it may become inflamed and tender. This may  require opening and draining the cyst. Treatment with antibiotics may be necessary. When the infection is gone, the cyst may be removed with minor surgery.  Small, inflamed cysts can often be treated with antibiotics or by injecting steroid medicines.  Sometimes, epidermal cysts become large and bothersome. If this happens, surgical removal in your caregiver's office may be  necessary. HOME CARE INSTRUCTIONS  Only take over-the-counter or prescription medicines as directed by your caregiver.  Take your antibiotics as directed. Finish them even if you start to feel better. SEEK MEDICAL CARE IF:   Your cyst becomes tender, red, or swollen.  Your condition is not improving or is getting worse.  You have any other questions or concerns. MAKE SURE YOU:  Understand these instructions.  Will watch your condition.  Will get help right away if you are not doing well or get worse. Document Released: 02/21/2004 Document Revised: 06/14/2011 Document Reviewed: 09/28/2010 [facility] Patient Information 2015 [place], [place]. This information is not intended to replace advice given to you by your health care provider. Make sure you discuss any questions you have with your health care provider. Indigestion Indigestion is discomfort in the upper abdomen that is caused by underlying problems such as gastroesophageal reflux disease (GERD), ulcers, or gallbladder problems.  CAUSES  Indigestion can be caused by many things. Possible causes include:  Stomach acid in the esophagus.  Stomach infections, usually caused by the bacteria H. pylori.  Being overweight.  Hiatal hernia. This means part of the stomach pushes up through the diaphragm.  Overeating.  Emotional problems, such as stress, anxiety, or depression.  Poor nutrition.  Consuming too much alcohol, tobacco, or caffeine.  Consuming spicy foods, fats, peppermint, chocolate, tomato products, citrus, or fruit juices.  Medicines such as aspirin and other anti-inflammatory drugs, hormones, steroids, and thyroid medicines.  Gastroparesis. This is a condition in which the stomach does not empty properly.  Stomach cancer.  Pregnancy, due to an increase in hormone levels, a relaxation of muscles in the digestive tract, and pressure on the stomach from the growing fetus. SYMPTOMS   Uncomfortable feeling of  fullness after eating.  Pain or burning sensation in the upper abdomen.  Bloating.  Belching and gas.  Nausea and vomiting.  Acidic taste in the mouth.  Burning sensation in the chest (heartburn). DIAGNOSIS  Your caregiver will review your medical history and perform a physical exam. Other tests, such as blood tests, stool tests, X-rays, and other imaging scans, may be done to check for more serious problems. TREATMENT  Liquid antacids and other drugs may be given to block stomach acid secretion. Medicines that increase esophageal muscle tone may also be given to help reduce symptoms. If an infection is found, antibiotic medicine may be given. HOME CARE INSTRUCTIONS  Avoid foods and drinks that make your symptoms worse, such as:  Caffeine or alcoholic drinks.  Chocolate.  Peppermint or mint flavorings.  Garlic and onions.  Spicy foods.  Citrus fruits, such as oranges, lemons, or limes.  Tomato-based foods such as sauce, chili, salsa, and pizza.  Fried and fatty foods.  Avoid eating for the 3 hours prior to your bedtime.  Eat small, frequent meals instead of large meals.  Stop smoking if you smoke.  Maintain a healthy weight.  Wear loose-fitting clothing. Do not wear anything tight around your waist that causes pressure on your stomach.  Raise the head of your bed 4 to 8 inches with wood blocks to help you sleep. Extra pillows will  not help.  Only take over-the-counter or prescription medicines as directed by your caregiver.  Do not take aspirin, ibuprofen, or other nonsteroidal anti-inflammatory drugs (NSAIDs). SEEK IMMEDIATE MEDICAL CARE IF:   You are not better after 2 days.  You have chest pressure or pain that radiates up into your neck, arms, back, jaw, or upper abdomen.  You have difficulty swallowing.  You keep vomiting.  You have black or bloody stools.  You have a fever.  You have dizziness, fainting, difficulty breathing, or heavy  sweating.  You have severe abdominal pain.  You lose weight without trying. MAKE SURE YOU:  Understand these instructions.  Will watch your condition.  Will get help right away if you are not doing well or get worse. Document Released: 04/29/2004 Document Revised: 06/14/2011 Document Reviewed: 11/04/2010 [facility] Patient Information 2015 [place], [place]. This information is not intended to replace advice given to you by your health care provider. Make sure you discuss any questions you have with your health care provider.

## 2013-11-06 NOTE — ED Notes (Signed)
Pt is concerned about a lump on his upper abdominal area x6 months. He also reports he has been having nausea, diarrhea and stomach pain x2 weeks.

## 2013-11-09 NOTE — ED Provider Notes (Signed)
Medical screening examination/treatment/procedure(s) were performed by non-physician practitioner and as supervising physician I was immediately available for consultation/collaboration.     Veryl Speak, MD 11/09/13 1308

## 2013-11-20 ENCOUNTER — Telehealth: Payer: Self-pay | Admitting: Nurse Practitioner

## 2013-11-20 NOTE — Telephone Encounter (Addendum)
Crystal H with [facility] @ [phone], requesting surgery clearance for patient.  Patient scheduled for upper Endoscopy and Colonoscopy on 9/1.  Please call with any questions or fax clearance to [phone].  Thanks   Letter requested is ready, Please fax over. Dr. Krista Blue

## 2013-11-20 NOTE — Telephone Encounter (Addendum)
Called Crystal back and spoke to her Fine to wait until Dr.Yan return 11-22-2013

## 2013-11-22 ENCOUNTER — Ambulatory Visit: Payer: Self-pay | Admitting: Nurse Practitioner

## 2013-11-23 NOTE — Telephone Encounter (Signed)
I faxed the information to the number that was provided by the patient.

## 2013-12-12 ENCOUNTER — Telehealth: Payer: Self-pay | Admitting: Nurse Practitioner

## 2013-12-12 ENCOUNTER — Telehealth: Payer: Self-pay | Admitting: *Deleted

## 2013-12-12 MED ORDER — CARBATROL 200 MG PO CP12: ORAL_CAPSULE | ORAL | Status: DC

## 2013-12-12 NOTE — Telephone Encounter (Signed)
Rx has been sent to [facility].  I called [facility] (please see previous note).  They are unable to verify the info via phone, and will be sending a fax.  I called patient back on number left in this message.  Got a recording saying the number is temporarily disconnected.  Tried Mobile number on file, got no answer and voicemail had not been set up.  Unable to leave message.

## 2013-12-12 NOTE — Telephone Encounter (Signed)
Mother calling for pt.   He is almost out of  Carbatrol 200mg  caps (400mg  po am, 600mg  po pm).   Will need bridge for about 10 days thru [facility] in [facility] until 90 day supply comes from [facility].  She stated a form was to come to [place] about this.  There phone # [phone], fax [phone].  Call her on cell.  (her vm is not set up).

## 2013-12-12 NOTE — Telephone Encounter (Signed)
Ivin Booty with [facility] Patient Assistant Program @ [phone], faxing Doctor page for assistant program for signature for medication CARBATROL 200 MG 12 hr capsule .  Please call and advise

## 2013-12-12 NOTE — Telephone Encounter (Signed)
Form received, verified and faxed back

## 2013-12-12 NOTE — Telephone Encounter (Signed)
I called back.  Spoke with Clarene Critchley.  She said they will be faxing [place] a page to verify the patient Rx info, which we will need to check the box and/or make any changes if needed and return.  Nothing can be done via phone.

## 2013-12-13 ENCOUNTER — Telehealth: Payer: Self-pay

## 2013-12-13 NOTE — Telephone Encounter (Signed)
[facility] notified us they have extended the Patient Assistance approval on Carbatrol until 12/14/2014.  They have also notified the patient of this decision.  Ref Case # [id number]

## 2014-01-22 ENCOUNTER — Ambulatory Visit (INDEPENDENT_AMBULATORY_CARE_PROVIDER_SITE_OTHER): Payer: Self-pay | Admitting: Nurse Practitioner

## 2014-01-22 ENCOUNTER — Encounter: Payer: Self-pay | Admitting: Nurse Practitioner

## 2014-01-22 VITALS — BP 110/72 | HR 90 | Ht 70.0 in | Wt 266.0 lb

## 2014-01-22 DIAGNOSIS — G8929 Other chronic pain: Secondary | ICD-10-CM

## 2014-01-22 DIAGNOSIS — G40309 Generalized idiopathic epilepsy and epileptic syndromes, not intractable, without status epilepticus: Secondary | ICD-10-CM

## 2014-01-22 DIAGNOSIS — R51 Headache: Secondary | ICD-10-CM

## 2014-01-22 MED ORDER — TOPIRAMATE 25 MG PO TABS: 50.0000 mg | ORAL_TABLET | Freq: Two times a day (BID) | ORAL | Status: DC

## 2014-01-22 NOTE — Progress Notes (Signed)
[facility]  PATIENT: Luis Alexander DOB: [birth year]   REASON FOR VISIT: follow up for seizure disorder   HISTORY OF PRESENT ILLNESS:Luis Alexander, 33 year old male returns for followup. He has a history of generalized seizure disorder and is currently on Carbatrol and Topamax.  He was last seen in this office 09/28/13 and last seizure event was  June 22 after having a seizure and hitting his head. CT of the head was normal. Carbamazepine level was 11. He denies missing any doses of his seizure meds. Topamax was added. He has had poor medical followup in the past.  He does not have insurance. He had recent CBC and CMP which were within normal limits.  He is accompanied by his mom today. He gets his Carbatrol through patient assistance. His Patient assistance is just been renewed for one year He returns for reevaluation.     REVIEW OF SYSTEMS: Full 14 system review of systems performed and notable only for those listed, all others are neg:  Constitutional: N/A  Cardiovascular: N/A  Ear/Nose/Throat: N/A  Skin: N/A  Eyes: N/A  Respiratory: N/A  Gastroitestinal: Recent workup for abdominal pain Hematology/Lymphatic: N/A  Endocrine: N/A Musculoskeletal:N/A  Allergy/Immunology: N/A  Neurological: N/A Psychiatric: N/A Sleep : NA   ALLERGIES: No Known Allergies  HOME MEDICATIONS: Outpatient Prescriptions Prior to Visit  Medication Sig Dispense Refill  . CARBATROL 200 MG 12 hr capsule Take 2 capsules by mouth every morning, and 3 capsules at bedtime  50 capsule  2  . topiramate (TOPAMAX) 25 MG tablet Take 2 tablets (50 mg total) by mouth 2 (two) times daily.  120 tablet  3  . omeprazole (PRILOSEC) 20 MG capsule Take one tablet twice daily for 5 days then once daily after that  30 capsule  0   No facility-administered medications prior to visit.    PAST MEDICAL HISTORY: Past Medical History  Diagnosis Date  . Seizures     last seizure Dec 2013  . Obesity      PAST SURGICAL HISTORY: Past Surgical History  Procedure Laterality Date  . Elbow surgery    . Knee surgery      FAMILY HISTORY: Family History  Problem Relation Age of Onset  . Cancer Father     SOCIAL HISTORY: History   Social History  . Marital Status: Single    Spouse Name: N/A    Number of Children: 0  . Years of Education: 12   Occupational History  . [employer]    Social History Main Topics  . Smoking status: Current Every Day Smoker -- 0.50 packs/day    Types: Cigarettes  . Smokeless tobacco: Never Used  . Alcohol Use: No  . Drug Use: No  . Sexual Activity: Not on file   Other Topics Concern  . Not on file   Social History Narrative   Patient is single with no children   Patient is left handed   Patient has a high school education   Patient drinks 4 cups daily           PHYSICAL EXAM  Filed Vitals:   01/22/14 1536  BP: 110/72  Pulse: 90  Height: 5\' 10"  (1.778 m)  Weight: 266 lb (120.657 kg)   Body mass index is 38.17 kg/(m^2). General: well developed, obese male seated, in no evident distress  Neurologic Exam  Mental Status: Awake and fully alert. Oriented to place and time. Follows all commands. Speech and language normal.  Cranial Nerves: Pupils equal,  briskly reactive to light. Extraocular movements full without nystagmus. Visual fields full to confrontation. Hearing intact and symmetric to finger snap. Facial sensation intact. Face, tongue, palate move normally and symmetrically. Neck flexion and extension normal.  Motor: Normal bulk and tone. Normal strength in all tested extremity muscles.No focal weakness  Sensory : Decreased pinprick on the right as compared to the left, soft touch and vibratory normal  Coordination: Rapid alternating movements normal in all extremities. Finger-to-nose and heel-to-shin performed accurately bilaterally.  Gait and Station: Arises from chair without difficulty. Stance is normal. Gait demonstrates normal  stride length and balance . Able to heel, toe and tandem walk without difficulty.  Reflexes: 1+ and symmetric. Toes downgoing.   DIAGNOSTIC DATA (LABS, IMAGING, TESTING) - I reviewed patient records, labs, notes, testing and imaging myself where available.  Lab Results  Component Value Date   WBC 7.4 11/06/2013   HGB 14.4 11/06/2013   HCT 42.7 11/06/2013   MCV 94.3 11/06/2013   PLT 223 11/06/2013      Component Value Date/Time   NA 142 11/06/2013 1245   NA 142 09/28/2013 1611   K 4.0 11/06/2013 1245   CL 105 11/06/2013 1245   CO2 20 11/06/2013 1245   GLUCOSE 107* 11/06/2013 1245   GLUCOSE 86 09/28/2013 1611   BUN 17 11/06/2013 1245   BUN 16 09/28/2013 1611   CREATININE 0.90 11/06/2013 1245   CALCIUM 9.4 11/06/2013 1245   PROT 7.2 11/06/2013 1245   PROT 7.2 09/28/2013 1611   ALBUMIN 4.2 11/06/2013 1245   AST 24 11/06/2013 1245   ALT 42 11/06/2013 1245   ALKPHOS 79 11/06/2013 1245   BILITOT 0.3 11/06/2013 1245   GFRNONAA >90 11/06/2013 1245   GFRAA >90 11/06/2013 1245      ASSESSMENT AND PLAN  33 y.o. year old male  has a past medical history of Seizures and Obesity. here to followup. Last seizure in 09/24/2013  Continue Carbatrol at current dose refills through pt assistance Continue Topamax at current dose will refill Call for any seizure activity Followup in 6-8 months Dennie Bible, [facility], [facility], [facility] [address] [place], [place] [address] [phone]

## 2014-01-22 NOTE — Patient Instructions (Addendum)
Continue Carbatrol at current dose will refill Continue Topamax at current dose will refill Call for any seizure activity Followup in 6-8 months

## 2014-09-11 ENCOUNTER — Encounter (HOSPITAL_BASED_OUTPATIENT_CLINIC_OR_DEPARTMENT_OTHER): Payer: Self-pay | Admitting: *Deleted

## 2014-09-11 ENCOUNTER — Emergency Department (HOSPITAL_BASED_OUTPATIENT_CLINIC_OR_DEPARTMENT_OTHER)
Admission: EM | Admit: 2014-09-11 | Discharge: 2014-09-11 | Disposition: A | Discharge status: Home / Self Care | Payer: Self-pay | Attending: Emergency Medicine | Admitting: Emergency Medicine

## 2014-09-11 ENCOUNTER — Emergency Department (HOSPITAL_BASED_OUTPATIENT_CLINIC_OR_DEPARTMENT_OTHER): Payer: Self-pay

## 2014-09-11 DIAGNOSIS — R51 Headache: Secondary | ICD-10-CM | POA: Insufficient documentation

## 2014-09-11 DIAGNOSIS — R519 Headache, unspecified: Secondary | ICD-10-CM

## 2014-09-11 DIAGNOSIS — E669 Obesity, unspecified: Secondary | ICD-10-CM | POA: Insufficient documentation

## 2014-09-11 DIAGNOSIS — Z79899 Other long term (current) drug therapy: Secondary | ICD-10-CM | POA: Insufficient documentation

## 2014-09-11 DIAGNOSIS — Z720 Tobacco use: Secondary | ICD-10-CM | POA: Insufficient documentation

## 2014-09-11 DIAGNOSIS — G40409 Other generalized epilepsy and epileptic syndromes, not intractable, without status epilepticus: Secondary | ICD-10-CM | POA: Insufficient documentation

## 2014-09-11 DIAGNOSIS — G40309 Generalized idiopathic epilepsy and epileptic syndromes, not intractable, without status epilepticus: Secondary | ICD-10-CM

## 2014-09-11 DIAGNOSIS — H53149 Visual discomfort, unspecified: Secondary | ICD-10-CM | POA: Insufficient documentation

## 2014-09-11 LAB — CBC WITH DIFFERENTIAL/PLATELET
BASOS ABS: 0 10*3/uL (ref 0.0–0.1)
BASOS PCT: 0 % (ref 0–1)
EOS ABS: 0.2 10*3/uL (ref 0.0–0.7)
EOS PCT: 3 % (ref 0–5)
HCT: 42.6 % (ref 39.0–52.0)
HEMOGLOBIN: 13.9 g/dL (ref 13.0–17.0)
LYMPHS ABS: 2.1 10*3/uL (ref 0.7–4.0)
LYMPHS PCT: 27 % (ref 12–46)
MCH: 31.3 pg (ref 26.0–34.0)
MCHC: 32.6 g/dL (ref 30.0–36.0)
MCV: 95.9 fL (ref 78.0–100.0)
MONO ABS: 0.8 10*3/uL (ref 0.1–1.0)
Monocytes Relative: 10 % (ref 3–12)
NEUTROS ABS: 4.8 10*3/uL (ref 1.7–7.7)
Neutrophils Relative %: 60 % (ref 43–77)
PLATELETS: 244 10*3/uL (ref 150–400)
RBC: 4.44 MIL/uL (ref 4.22–5.81)
RDW: 12.6 % (ref 11.5–15.5)
WBC: 8 10*3/uL (ref 4.0–10.5)

## 2014-09-11 LAB — COMPREHENSIVE METABOLIC PANEL
ALBUMIN: 4.1 g/dL (ref 3.5–5.0)
ALT: 26 U/L (ref 17–63)
AST: 19 U/L (ref 15–41)
Alkaline Phosphatase: 74 U/L (ref 38–126)
Anion gap: 4 — ABNORMAL LOW (ref 5–15)
BUN: 15 mg/dL (ref 6–20)
CHLORIDE: 113 mmol/L — AB (ref 101–111)
CO2: 23 mmol/L (ref 22–32)
CREATININE: 0.8 mg/dL (ref 0.61–1.24)
Calcium: 8.9 mg/dL (ref 8.9–10.3)
GFR calc Af Amer: >60 mL/min (ref >60)
GFR calc non Af Amer: >60 mL/min (ref >60)
GLUCOSE: 95 mg/dL (ref 65–99)
Potassium: 4 mmol/L (ref 3.5–5.1)
SODIUM: 140 mmol/L (ref 135–145)
Total Bilirubin: 0.1 mg/dL — ABNORMAL LOW (ref 0.3–1.2)
Total Protein: 6.6 g/dL (ref 6.5–8.1)

## 2014-09-11 LAB — CARBAMAZEPINE LEVEL, TOTAL: CARBAMAZEPINE LVL: 10.5 ug/mL (ref 4.0–12.0)

## 2014-09-11 MED ORDER — METOCLOPRAMIDE HCL 5 MG/ML IJ SOLN
10.0000 mg | Freq: Once | INTRAMUSCULAR | Status: AC
  Administered 2014-09-11: 10 mg via INTRAVENOUS
  Filled 2014-09-11: qty 2

## 2014-09-11 MED ORDER — DIPHENHYDRAMINE HCL 50 MG/ML IJ SOLN
25.0000 mg | Freq: Once | INTRAMUSCULAR | Status: AC
  Administered 2014-09-11: 25 mg via INTRAVENOUS
  Filled 2014-09-11: qty 1

## 2014-09-11 MED ORDER — SODIUM CHLORIDE 0.9 % IV BOLUS (SEPSIS)
1000.0000 mL | Freq: Once | INTRAVENOUS | Status: AC
  Administered 2014-09-11: 1000 mL via INTRAVENOUS

## 2014-09-11 NOTE — ED Notes (Signed)
MD at bedside. 

## 2014-09-11 NOTE — ED Notes (Signed)
Patient transported to CT 

## 2014-09-11 NOTE — ED Notes (Signed)
Pt reports he had a seizure Monday and has a constant headache since

## 2014-09-11 NOTE — ED Provider Notes (Signed)
CSN: [id number]     Arrival date & time 09/11/14  0906 History   First MD Initiated Contact with Patient 09/11/14 [phone]     Chief Complaint  Patient presents with  . Headache     (Consider location/radiation/quality/duration/timing/severity/associated sxs/prior Treatment) The history is provided by the patient.  Luis Alexander is a 34 y.o. male history obesity, grand mal seizures here presenting with seizure, headaches. States that he had a grand mal seizure 2 days ago. He didn't remember what happened but woke up on the floor. Since then he has been having headaches. States that the headaches were frontal and associated with some blurred vision as well as photophobia. Denies any fevers or chills. Denies any neck pain. Has noticed some left eye twitching afterwards. Has been taking his current [facility] and Topamax as prescribed. Has been followed up with [facility] neurology.    Past Medical History  Diagnosis Date  . Obesity   . Seizures     last seizure 09/09/14   Past Surgical History  Procedure Laterality Date  . Elbow surgery    . Knee surgery     Family History  Problem Relation Age of Onset  . Cancer Father    History  Substance Use Topics  . Smoking status: Current Every Day Smoker -- 0.50 packs/day    Types: Cigarettes  . Smokeless tobacco: Never Used  . Alcohol Use: No    Review of Systems  Neurological: Positive for headaches.  All other systems reviewed and are negative.     Allergies  Review of patient's allergies indicates no known allergies.  Home Medications   Prior to Admission medications   Medication Sig Start Date End Date Taking? Authorizing Provider  CARBATROL 200 MG 12 hr capsule Take 2 capsules by mouth every morning, and 3 capsules at bedtime 12/12/13   Marcial Pacas, MD  topiramate (TOPAMAX) 25 MG tablet Take 2 tablets (50 mg total) by mouth 2 (two) times daily. 01/22/14   Dennie Bible, NP   BP 155/81 mmHg  Pulse 78  Temp(Src) 97.8 F  (36.6 C) (Oral)  Resp 18  Ht 5\' 10"  (1.778 m)  Wt 240 lb (108.863 kg)  BMI 34.44 kg/m2  SpO2 97% Physical Exam  Constitutional: He is oriented to person, place, and time.  Slightly uncomfortable   HENT:  Head: Normocephalic.  Mouth/Throat: Oropharynx is clear and moist.  Eyes: Conjunctivae are normal. Pupils are equal, round, and reactive to light.  Mild photophobia. No eye deviation. Extra ocular movements intact   Neck: Normal range of motion. Neck supple.  Cardiovascular: Normal rate, regular rhythm and normal heart sounds.   Pulmonary/Chest: Effort normal and breath sounds normal. No respiratory distress. He has no wheezes. He has no rales.  Abdominal: Soft. Bowel sounds are normal. He exhibits no distension. There is no tenderness. There is no rebound.  Musculoskeletal: Normal range of motion. He exhibits no edema or tenderness.  Neurological: He is alert and oriented to person, place, and time. No cranial nerve deficit. Coordination normal.  CN 2-12 intact. Nl finger to nose. Nl gait. Nl strength throughout. No pronator drift.   Skin: Skin is warm and dry.  Psychiatric: He has a normal mood and affect. His behavior is normal. Judgment and thought content normal.  Nursing note and vitals reviewed.   ED Course  Procedures (including critical care time) Labs Review Labs Reviewed  COMPREHENSIVE METABOLIC PANEL - Abnormal; Notable for the following:    Chloride 113 (*)  Total Bilirubin 0.1 (*)    Anion gap 4 (*)    All other components within normal limits  CBC WITH DIFFERENTIAL/PLATELET  CARBAMAZEPINE LEVEL, TOTAL    Imaging Review Ct Head Wo Contrast  09/11/2014   CLINICAL DATA:  Headache. Seizure Monday. Frontal headache since the seizure. Initial encounter.  EXAM: CT HEAD WITHOUT CONTRAST  TECHNIQUE: Contiguous axial images were obtained from the base of the skull through the vertex without intravenous contrast.  COMPARISON:  09/24/2013.  FINDINGS: No mass lesion, mass  effect, midline shift, hydrocephalus, hemorrhage. No territorial ischemia or acute infarction. Calvarium intact. Hypoplastic LEFT frontal sinus. Mastoid air cells clear. Mild mucosal thickening in the sphenoid sinuses.  IMPRESSION: Negative CT head.   Electronically Signed   By: Dereck Ligas M.D.   On: 09/11/2014 10:15     EKG Interpretation None      MDM   Final diagnoses:  None    Luis Alexander is a 34 y.o. male here with seizure 2 days ago now has headaches. Consider migraines vs concussion vs bleed. Will give migraine cocktail and get CT head and labs and Carbamezapine level.    11:00 AM Carbamezapine level therapeutic, CT head unremarkable. Labs unremarkable. Headache improved with migraine cocktail. Discussed with Dr. Doy Mince, who recommend that patient call his neurologist regarding follow up and medication adjustment. She states that its not too unusual to have prolonged headaches after seizure. Felt better, will dc home.    Wandra Arthurs, MD 09/11/14 1101

## 2014-09-11 NOTE — ED Notes (Signed)
Pt given note for work- Pt verbalizes understanding of f/u instructions and has a ride home

## 2014-09-11 NOTE — Discharge Instructions (Signed)
Continue taking your seizure medicines.   Do NOT drive until you see neurology.   Take tylenol, motrin for headaches.   Call your neurologist for follow up in a week.   Return to ER if you have severe headaches, seizures.

## 2014-09-12 ENCOUNTER — Ambulatory Visit (INDEPENDENT_AMBULATORY_CARE_PROVIDER_SITE_OTHER): Payer: Self-pay | Admitting: Nurse Practitioner

## 2014-09-12 ENCOUNTER — Encounter: Payer: Self-pay | Admitting: Nurse Practitioner

## 2014-09-12 VITALS — BP 117/80 | HR 84 | Ht 69.5 in | Wt 266.5 lb

## 2014-09-12 DIAGNOSIS — R51 Headache: Secondary | ICD-10-CM

## 2014-09-12 DIAGNOSIS — G253 Myoclonus: Secondary | ICD-10-CM

## 2014-09-12 DIAGNOSIS — G8929 Other chronic pain: Secondary | ICD-10-CM

## 2014-09-12 DIAGNOSIS — G40309 Generalized idiopathic epilepsy and epileptic syndromes, not intractable, without status epilepticus: Secondary | ICD-10-CM

## 2014-09-12 MED ORDER — TOPIRAMATE 25 MG PO TABS: 75.0000 mg | ORAL_TABLET | Freq: Two times a day (BID) | ORAL | Status: DC

## 2014-09-12 NOTE — Patient Instructions (Signed)
Increase Topamax to 75 mg twice daily, will call in new prescription Continue Carbatrol at current dose, gets through patient assistance No standing at heights such as rooftops, trees etc No swimming alone, no tub baths or hot tubs Avoid open fires Please maintain seizure precautions. Do not participate in activities where a loss of awareness could hurt yourself or someone else.  If you have a seizure, according to [place] law, no driving any type of motorized vehicle until seizure free for 6 months and under a physicians care Call for further seizure activity Follow-up in 6 months

## 2014-09-12 NOTE — Progress Notes (Signed)
[facility]  PATIENT: Luis Alexander DOB: 1980/05/12   REASON FOR VISIT: Follow-up for recent seizure and history of headaches  HISTORY FROM: Patient    HISTORY OF PRESENT ILLNESS:Luis Alexander, 34 year old male returns for followup. He was last seen in this office 01/22/2014. He has a history of generalized seizure disorder and is currently on Carbatrol and Topamax. He had a seizure event on 09/09/2014 while at home, found himself lying on floor. He denies any recent infection or  sleep deprivation. He claims he has been taking his medications. He went to the emergency room on 09/11/14 because of continued headache in the frontal area associated with some blurred vision and photophobia. He was given a migraine cocktail which resolved his headache. CT of the head was unremarkable. Carbamazepine level was 10.5   He is also on Topamax 50 mg twice daily He has had poor medical followup in the past. He does not have insurance.  He is accompanied by his mom today. He gets his Carbatrol through patient assistance. He returns for reevaluation.    REVIEW OF SYSTEMS: Full 14 system review of systems performed and notable only for those listed, all others are neg:  Constitutional: neg  Cardiovascular: neg Ear/Nose/Throat: neg  Skin: neg Eyes: Eye pain Respiratory: neg Gastroitestinal: neg  Hematology/Lymphatic: neg  Endocrine: neg Musculoskeletal:neg Allergy/Immunology: neg Neurological: Recent seizure Psychiatric: neg Sleep : neg   ALLERGIES: No Known Allergies  HOME MEDICATIONS: Outpatient Prescriptions Prior to Visit  Medication Sig Dispense Refill  . CARBATROL 200 MG 12 hr capsule Take 2 capsules by mouth every morning, and 3 capsules at bedtime 50 capsule 2  . topiramate (TOPAMAX) 25 MG tablet Take 2 tablets (50 mg total) by mouth 2 (two) times daily. 120 tablet 8   No facility-administered medications prior to visit.    PAST MEDICAL HISTORY: Past Medical  History  Diagnosis Date  . Obesity   . Seizures     last seizure 09/09/14    PAST SURGICAL HISTORY: Past Surgical History  Procedure Laterality Date  . Elbow surgery    . Knee surgery      FAMILY HISTORY: Family History  Problem Relation Age of Onset  . Cancer Father     SOCIAL HISTORY: History   Social History  . Marital Status: Single    Spouse Name: N/A  . Number of Children: 0  . Years of Education: 12   Occupational History  . [employer]    Social History Main Topics  . Smoking status: Current Every Day Smoker -- 2.00 packs/day    Types: Cigarettes  . Smokeless tobacco: Never Used  . Alcohol Use: No  . Drug Use: No  . Sexual Activity: Not on file   Other Topics Concern  . Not on file   Social History Narrative   Patient is single with no children   Patient is left handed   Patient has a high school education   Patient drinks 4 cups daily           PHYSICAL EXAM  Filed Vitals:   09/12/14 1120  BP: 117/80  Pulse: 84  Height: 5' 9.5" (1.765 m)  Weight: 266 lb 8 oz (120.884 kg)   Body mass index is 38.8 kg/(m^2). General: well developed, obese male seated, in no evident distress  Neurologic Exam  Mental Status: Awake and fully alert. Oriented to place and time. Follows all commands. Speech and language normal.  Cranial Nerves: Pupils equal, briskly reactive to light.  Extraocular movements full without nystagmus. Visual fields full to confrontation. Hearing intact and symmetric to finger snap. Facial sensation intact. Face, tongue, palate move normally and symmetrically. Neck flexion and extension normal.  Motor: Normal bulk and tone. Normal strength in all tested extremity muscles.No focal weakness  Coordination: Rapid alternating movements normal in all extremities. Finger-to-nose and heel-to-shin performed accurately bilaterally.  Gait and Station: Arises from chair without difficulty. Stance is normal. Gait demonstrates normal stride length and  balance . Able to heel, toe and tandem walk without difficulty.  Reflexes: 1+ and symmetric. Toes downgoing.   DIAGNOSTIC DATA (LABS, IMAGING, TESTING) - I reviewed patient records, labs, notes, testing and imaging myself where available.  Lab Results  Component Value Date   WBC 8.0 09/11/2014   HGB 13.9 09/11/2014   HCT 42.6 09/11/2014   MCV 95.9 09/11/2014   PLT 244 09/11/2014      Component Value Date/Time   NA 140 09/11/2014 0935   NA 142 09/28/2013 1611   K 4.0 09/11/2014 0935   CL 113* 09/11/2014 0935   CO2 23 09/11/2014 0935   GLUCOSE 95 09/11/2014 0935   GLUCOSE 86 09/28/2013 1611   BUN 15 09/11/2014 0935   BUN 16 09/28/2013 1611   CREATININE 0.80 09/11/2014 0935   CALCIUM 8.9 09/11/2014 0935   PROT 6.6 09/11/2014 0935   PROT 7.2 09/28/2013 1611   ALBUMIN 4.1 09/11/2014 0935   AST 19 09/11/2014 0935   ALT 26 09/11/2014 0935   ALKPHOS 74 09/11/2014 0935   BILITOT 0.1* 09/11/2014 0935   GFRNONAA >60 09/11/2014 0935   GFRAA >60 09/11/2014 0935   ASSESSMENT AND PLAN  34 y.o. year old male  has a past medical history of Obesity and Seizures. here here to follow-up after a recent seizure on 09/09/2014. ER record reviewed He also has a history of headaches.  Increase Topamax to 75 mg twice daily, will call in new prescription Continue Carbatrol at current dose, gets through patient assistance No standing at heights such as rooftops, trees etc No swimming alone, no tub baths or hot tubs Avoid open fires Please maintain seizure precautions. Do not participate in activities where a loss of awareness could hurt yourself or someone else.  If you have a seizure, according to [place] law, no driving any type of motorized vehicle until seizure free for 6 months and under a physicians care Call for further seizure activity Follow-up in 6 months Dennie Bible, [place], [facility], [facility] [address] [place], [place] [address] [phone]

## 2014-09-13 NOTE — Progress Notes (Signed)
I have reviewed and agreed above plan. 

## 2014-09-23 ENCOUNTER — Telehealth: Payer: Self-pay | Admitting: Nurse Practitioner

## 2014-09-23 ENCOUNTER — Ambulatory Visit: Payer: Self-pay | Admitting: Nurse Practitioner

## 2014-09-23 MED ORDER — TOPIRAMATE 25 MG PO TABS: 100.0000 mg | ORAL_TABLET | Freq: Two times a day (BID) | ORAL | Status: DC

## 2014-09-23 NOTE — Telephone Encounter (Signed)
Patients mother called and requested to speak with the nurse regarding her son. She stated that he had another seizure this morning and would like to touch base with our office regarding this. Please call and advise.

## 2014-09-23 NOTE — Telephone Encounter (Signed)
TC to mother. Will increase Topamax to 100mg  BID will send new Rx, since [employer],  check with him about the chemicals used. Strong chemicals can sometime cause a seizure. May need to wear a mask. She is agreeable.

## 2014-09-23 NOTE — Telephone Encounter (Signed)
Spoke to mother of pt.  Pt had another seizure this morning.   Has been taking increased dose of topamax  75mg  po bid as ordered as well as the carbatrol 400mg  am and 600mg  pm.   No obvious triggers states mother other then he is stressed.  (did go over triggers, sleep deprivation, dehydration, over heating, low blood sugar/ skipping meals, alcohol, stress).  Will consult Hoyle Sauer, NP and call her back.

## 2014-09-24 ENCOUNTER — Telehealth: Payer: Self-pay | Admitting: *Deleted

## 2014-09-24 NOTE — Telephone Encounter (Signed)
Mother called back, I had not heard from pt, Luis Alexander.  He had had for 2 wks after seizure 2 wks ago.  Since the last one yesterday, he is having headache again.  Not sure of level, but enough to cause him to try ans sleep to get rid of it.  Would like to know if anything could be called in for him, he does not have insurance so it would be out of pocket.  Please advise.

## 2014-09-24 NOTE — Telephone Encounter (Signed)
Pt has tried tylenol and ibuprofen and nothing has helped.  Would like to go ahead and call in imitrex (generic) for him [facility] at [address].

## 2014-09-24 NOTE — Telephone Encounter (Signed)
I called and LMVM for pt on his home # to call back re: to what is going on with him.

## 2014-09-24 NOTE — Telephone Encounter (Signed)
Patients mother called back and stated that she forgot to tell the nurse that the patient has also been experiencing headaches. Requested that the nurse return her call. Please call and advise.

## 2014-09-25 MED ORDER — PREDNISONE 10 MG PO TABS: 10.0000 mg | ORAL_TABLET | Freq: Every day | ORAL | Status: DC

## 2014-09-25 NOTE — Telephone Encounter (Signed)
Attempted to call mother back, rang and rang then got busy signal. Will try later

## 2014-09-25 NOTE — Telephone Encounter (Signed)
Called mom patient is probably rebounding from OTC tylenol and Ibuprofen. Since his headache has been almost daily will call in Prednisone dose pack for 6 days. Take as directed. After this can use Aleve prn. May call in Imitrex at a later date. She is agreeable to this.

## 2014-11-25 ENCOUNTER — Encounter (HOSPITAL_BASED_OUTPATIENT_CLINIC_OR_DEPARTMENT_OTHER): Payer: Self-pay

## 2014-11-25 ENCOUNTER — Emergency Department (HOSPITAL_BASED_OUTPATIENT_CLINIC_OR_DEPARTMENT_OTHER)
Admission: EM | Admit: 2014-11-25 | Discharge: 2014-11-25 | Disposition: A | Discharge status: Home / Self Care | Payer: Self-pay | Attending: Emergency Medicine | Admitting: Emergency Medicine

## 2014-11-25 DIAGNOSIS — J069 Acute upper respiratory infection, unspecified: Secondary | ICD-10-CM | POA: Insufficient documentation

## 2014-11-25 DIAGNOSIS — Z7952 Long term (current) use of systemic steroids: Secondary | ICD-10-CM | POA: Insufficient documentation

## 2014-11-25 DIAGNOSIS — Z79899 Other long term (current) drug therapy: Secondary | ICD-10-CM | POA: Insufficient documentation

## 2014-11-25 DIAGNOSIS — E669 Obesity, unspecified: Secondary | ICD-10-CM | POA: Insufficient documentation

## 2014-11-25 DIAGNOSIS — Z720 Tobacco use: Secondary | ICD-10-CM | POA: Insufficient documentation

## 2014-11-25 LAB — RAPID STREP SCREEN (MED CTR MEBANE ONLY): Streptococcus, Group A Screen (Direct): NEGATIVE

## 2014-11-25 NOTE — ED Notes (Signed)
C/o head congestion, sore throat, bilat earache-started last night

## 2014-11-25 NOTE — ED Provider Notes (Signed)
CSN: [id number]     Arrival date & time 11/25/14  1837 History   First MD Initiated Contact with Patient 11/25/14 1849     Chief Complaint  Patient presents with  . URI     (Consider location/radiation/quality/duration/timing/severity/associated sxs/prior Treatment) Patient is a 34 y.o. male presenting with URI. The history is provided by the patient. No language interpreter was used.  URI Presenting symptoms: congestion, cough, ear pain and sore throat   Presenting symptoms: no fever   Onset quality:  Sudden Duration:  1 day Timing:  Constant Progression:  Unchanged Chronicity:  New Relieved by:  Nothing Worsened by:  Nothing tried Ineffective treatments:  None tried   Past Medical History  Diagnosis Date  . Obesity   . Seizures     last seizure 09/09/14   Past Surgical History  Procedure Laterality Date  . Elbow surgery    . Knee surgery     Family History  Problem Relation Age of Onset  . Cancer Father    Social History  Substance Use Topics  . Smoking status: Current Every Day Smoker -- 2.00 packs/day    Types: Cigarettes  . Smokeless tobacco: Never Used  . Alcohol Use: No    Review of Systems  Constitutional: Negative for fever.  HENT: Positive for congestion, ear pain and sore throat.   Respiratory: Positive for cough.   All other systems reviewed and are negative.     Allergies  Review of patient's allergies indicates no known allergies.  Home Medications   Prior to Admission medications   Medication Sig Start Date End Date Taking? Authorizing Provider  CARBATROL 200 MG 12 hr capsule Take 2 capsules by mouth every morning, and 3 capsules at bedtime 12/12/13   Marcial Pacas, MD  predniSONE (DELTASONE) 10 MG tablet Take 1 tablet (10 mg total) by mouth daily with breakfast. 6 day dose pack 09/25/14   Dennie Bible, NP  topiramate (TOPAMAX) 25 MG tablet Take 4 tablets (100 mg total) by mouth 2 (two) times daily. 09/23/14   Dennie Bible, NP    BP 132/85 mmHg  Pulse 97  Temp(Src) 98.5 F (36.9 C) (Oral)  Resp 16  Ht 5\' 8"  (1.727 m)  Wt 266 lb (120.657 kg)  BMI 40.45 kg/m2  SpO2 100% Physical Exam  Constitutional: He is oriented to person, place, and time. He appears well-developed and well-nourished.  HENT:  Right Ear: External ear normal.  Left Ear: External ear normal.  Nose: Rhinorrhea present.  Mouth/Throat: Posterior oropharyngeal erythema present.  Neck: Normal range of motion. Neck supple.  Cardiovascular: Normal rate and regular rhythm.   Pulmonary/Chest: Effort normal and breath sounds normal.  Musculoskeletal: Normal range of motion.  Neurological: He is alert and oriented to person, place, and time.  Nursing note and vitals reviewed.   ED Course  Procedures (including critical care time) Labs Review Labs Reviewed  RAPID STREP SCREEN (NOT AT [facility])  CULTURE, GROUP A STREP    Imaging Review No results found. I have personally reviewed and evaluated these images and lab results as part of my medical decision-making.   EKG Interpretation None      MDM   Final diagnoses:  URI (upper respiratory infection)    Strep negative. Pt can do otc medications at home    Glendell Docker, NP 11/25/14 1911  Davonna Belling, MD 11/25/14 2257

## 2014-11-25 NOTE — Discharge Instructions (Signed)
Upper Respiratory Infection, Adult An upper respiratory infection (URI) is also sometimes known as the common cold. The upper respiratory tract includes the nose, sinuses, throat, trachea, and bronchi. Bronchi are the airways leading to the lungs. Most people improve within 1 week, but symptoms can last up to 2 weeks. A residual cough may last even longer.  CAUSES Many different viruses can infect the tissues lining the upper respiratory tract. The tissues become irritated and inflamed and often become very moist. Mucus production is also common. A cold is contagious. You can easily spread the virus to others by oral contact. This includes kissing, sharing a glass, coughing, or sneezing. Touching your mouth or nose and then touching a surface, which is then touched by another person, can also spread the virus. SYMPTOMS  Symptoms typically develop 1 to 3 days after you come in contact with a cold virus. Symptoms vary from person to person. They may include:  Runny nose.  Sneezing.  Nasal congestion.  Sinus irritation.  Sore throat.  Loss of voice (laryngitis).  Cough.  Fatigue.  Muscle aches.  Loss of appetite.  Headache.  Low-grade fever. DIAGNOSIS  You might diagnose your own cold based on familiar symptoms, since most people get a cold 2 to 3 times a year. Your caregiver can confirm this based on your exam. Most importantly, your caregiver can check that your symptoms are not due to another disease such as strep throat, sinusitis, pneumonia, asthma, or epiglottitis. Blood tests, throat tests, and X-rays are not necessary to diagnose a common cold, but they may sometimes be helpful in excluding other more serious diseases. Your caregiver will decide if any further tests are required. RISKS AND COMPLICATIONS  You may be at risk for a more severe case of the common cold if you smoke cigarettes, have chronic heart disease (such as heart failure) or lung disease (such as asthma), or if  you have a weakened immune system. The very young and very old are also at risk for more serious infections. Bacterial sinusitis, middle ear infections, and bacterial pneumonia can complicate the common cold. The common cold can worsen asthma and chronic obstructive pulmonary disease (COPD). Sometimes, these complications can require emergency medical care and may be life-threatening. PREVENTION  The best way to protect against getting a cold is to practice good hygiene. Avoid oral or hand contact with people with cold symptoms. Wash your hands often if contact occurs. There is no clear evidence that vitamin C, vitamin E, echinacea, or exercise reduces the chance of developing a cold. However, it is always recommended to get plenty of rest and practice good nutrition. TREATMENT  Treatment is directed at relieving symptoms. There is no cure. Antibiotics are not effective, because the infection is caused by a virus, not by bacteria. Treatment may include:  Increased fluid intake. Sports drinks offer valuable electrolytes, sugars, and fluids.  Breathing heated mist or steam (vaporizer or shower).  Eating chicken soup or other clear broths, and maintaining good nutrition.  Getting plenty of rest.  Using gargles or lozenges for comfort.  Controlling fevers with ibuprofen or acetaminophen as directed by your caregiver.  Increasing usage of your inhaler if you have asthma. Zinc gel and zinc lozenges, taken in the first 24 hours of the common cold, can shorten the duration and lessen the severity of symptoms. Pain medicines may help with fever, muscle aches, and throat pain. A variety of non-prescription medicines are available to treat congestion and runny nose. Your caregiver   can make recommendations and may suggest nasal or lung inhalers for other symptoms.  HOME CARE INSTRUCTIONS   Only take over-the-counter or prescription medicines for pain, discomfort, or fever as directed by your  caregiver.  Use a warm mist humidifier or inhale steam from a shower to increase air moisture. This may keep secretions moist and make it easier to breathe.  Drink enough water and fluids to keep your urine clear or pale yellow.  Rest as needed.  Return to work when your temperature has returned to normal or as your caregiver advises. You may need to stay home longer to avoid infecting others. You can also use a face mask and careful hand washing to prevent spread of the virus. SEEK MEDICAL CARE IF:   After the first few days, you feel you are getting worse rather than better.  You need your caregiver's advice about medicines to control symptoms.  You develop chills, worsening shortness of breath, or brown or red sputum. These may be signs of pneumonia.  You develop yellow or brown nasal discharge or pain in the face, especially when you bend forward. These may be signs of sinusitis.  You develop a fever, swollen neck glands, pain with swallowing, or white areas in the back of your throat. These may be signs of strep throat. SEEK IMMEDIATE MEDICAL CARE IF:   You have a fever.  You develop severe or persistent headache, ear pain, sinus pain, or chest pain.  You develop wheezing, a prolonged cough, cough up blood, or have a change in your usual mucus (if you have chronic lung disease).  You develop sore muscles or a stiff neck. Document Released: 09/15/2000 Document Revised: 06/14/2011 Document Reviewed: 06/27/2013 ExitCare Patient Information 2015 [employer]. This information is not intended to replace advice given to you by your health care provider. Make sure you discuss any questions you have with your health care provider.  

## 2014-11-28 LAB — CULTURE, GROUP A STREP: Strep A Culture: NEGATIVE

## 2015-03-16 ENCOUNTER — Emergency Department (HOSPITAL_BASED_OUTPATIENT_CLINIC_OR_DEPARTMENT_OTHER)
Admission: EM | Admit: 2015-03-16 | Discharge: 2015-03-16 | Disposition: A | Discharge status: Home / Self Care | Payer: Self-pay | Attending: Emergency Medicine | Admitting: Emergency Medicine

## 2015-03-16 ENCOUNTER — Encounter (HOSPITAL_BASED_OUTPATIENT_CLINIC_OR_DEPARTMENT_OTHER): Payer: Self-pay | Admitting: *Deleted

## 2015-03-16 DIAGNOSIS — E669 Obesity, unspecified: Secondary | ICD-10-CM | POA: Insufficient documentation

## 2015-03-16 DIAGNOSIS — L0201 Cutaneous abscess of face: Secondary | ICD-10-CM | POA: Insufficient documentation

## 2015-03-16 DIAGNOSIS — Z79899 Other long term (current) drug therapy: Secondary | ICD-10-CM | POA: Insufficient documentation

## 2015-03-16 DIAGNOSIS — F17210 Nicotine dependence, cigarettes, uncomplicated: Secondary | ICD-10-CM | POA: Insufficient documentation

## 2015-03-16 MED ORDER — HYDROCODONE-ACETAMINOPHEN 5-325 MG PO TABS: 1.0000 | ORAL_TABLET | ORAL | Status: DC | PRN

## 2015-03-16 MED ORDER — DOXYCYCLINE HYCLATE 100 MG PO TABS
100.0000 mg | ORAL_TABLET | Freq: Once | ORAL | Status: AC
  Administered 2015-03-16: 100 mg via ORAL
  Filled 2015-03-16: qty 1

## 2015-03-16 MED ORDER — NAPROXEN 500 MG PO TABS: 500.0000 mg | ORAL_TABLET | Freq: Two times a day (BID) | ORAL | Status: DC

## 2015-03-16 MED ORDER — DOXYCYCLINE HYCLATE 100 MG PO TABS: 100.0000 mg | ORAL_TABLET | Freq: Two times a day (BID) | ORAL | Status: DC

## 2015-03-16 MED ORDER — LIDOCAINE HCL (PF) 1 % IJ SOLN
5.0000 mL | Freq: Once | INTRAMUSCULAR | Status: AC
  Administered 2015-03-16: 5 mL via INTRADERMAL
  Filled 2015-03-16: qty 5

## 2015-03-16 MED ORDER — HYDROCODONE-ACETAMINOPHEN 5-325 MG PO TABS
1.0000 | ORAL_TABLET | ORAL | Status: AC
  Administered 2015-03-16: 1 via ORAL
  Filled 2015-03-16: qty 1

## 2015-03-16 NOTE — ED Provider Notes (Signed)
CSN: [id number]     Arrival date & time 03/16/15  1953 History  By signing my name below, I, Luis Alexander, attest that this documentation has been prepared under the direction and in the presence of Dorie Rank, MD. Electronically Signed: [name], ED Scribe. 03/16/2015. 8:18 PM.    Chief Complaint  Patient presents with  . Recurrent Skin Infections      The history is provided by the patient. No language interpreter was used.   HPI Comments: Luis Alexander is a 34 y.o. male who presents to the Emergency Department complaining of a constant, worsening abscess to the left lateral aspect of the mouth that has been present for four days. Pt reports he feels that the entire left side of his face is swollen. Pt states he has never had a problem like this before, and denies taking any medication to treat his symptoms.   Past Medical History  Diagnosis Date  . Obesity   . Seizures ([place])     last seizure 09/09/14   Past Surgical History  Procedure Laterality Date  . Elbow surgery    . Knee surgery     Family History  Problem Relation Age of Onset  . Cancer Father    Social History  Substance Use Topics  . Smoking status: Current Every Day Smoker -- 2.00 packs/day    Types: Cigarettes  . Smokeless tobacco: Never Used  . Alcohol Use: 0.0 oz/week    0 Standard drinks or equivalent per week    Review of Systems  A complete 10 system review of systems was obtained and all systems are negative except as noted in the HPI and PMH.    Allergies  Review of patient's allergies indicates no known allergies.  Home Medications   Prior to Admission medications   Medication Sig Start Date End Date Taking? Authorizing Provider  CARBATROL 200 MG 12 hr capsule Take 2 capsules by mouth every morning, and 3 capsules at bedtime 12/12/13  Yes Marcial Pacas, MD  predniSONE (DELTASONE) 10 MG tablet Take 1 tablet (10 mg total) by mouth daily with breakfast. 6 day dose pack 09/25/14  Yes Dennie Bible, NP  topiramate (TOPAMAX) 25 MG tablet Take 4 tablets (100 mg total) by mouth 2 (two) times daily. 09/23/14  Yes Dennie Bible, NP  doxycycline (VIBRA-TABS) 100 MG tablet Take 1 tablet (100 mg total) by mouth 2 (two) times daily. 03/16/15   Dorie Rank, MD  HYDROcodone-acetaminophen (NORCO/VICODIN) 5-325 MG tablet Take 1 tablet by mouth every 4 (four) hours as needed for moderate pain. 03/16/15   Dorie Rank, MD  naproxen (NAPROSYN) 500 MG tablet Take 1 tablet (500 mg total) by mouth 2 (two) times daily with a meal. As needed for pain 03/16/15   Dorie Rank, MD   BP 122/75 mmHg  Pulse 81  Temp(Src) 97.5 F (36.4 C) (Oral)  Resp 20  Ht 5\' 9"  (1.753 m)  Wt 102.059 kg  BMI 33.21 kg/m2  SpO2 100% Physical Exam  Constitutional: He appears well-developed and well-nourished. No distress.  HENT:  Head: Normocephalic and atraumatic.  Right Ear: External ear normal.  Left Ear: External ear normal.  1 cm raised indurated abscess to lateral aspect of mouth on left side No mucosal lesions No dental abnormality   Eyes: Conjunctivae are normal. Right eye exhibits no discharge. Left eye exhibits no discharge. No scleral icterus.  Neck: Neck supple. No tracheal deviation present.  Cardiovascular: Normal rate.   Pulmonary/Chest: Effort  normal. No stridor. No respiratory distress.  Musculoskeletal: He exhibits no edema.  Neurological: He is alert. Cranial nerve deficit: no gross deficits.  Skin: Skin is warm and dry. No rash noted.  Psychiatric: He has a normal mood and affect.  Nursing note and vitals reviewed.   ED Course  .Marland KitchenIncision and Drainage Date/Time: 03/16/2015 8:57 PM Performed by: Dorie Rank Authorized by: Dorie Rank Consent: Verbal consent obtained. Risks and benefits: risks, benefits and alternatives were discussed Consent given by: patient Type: abscess Body area: head Location details: face Anesthesia: local infiltration Local anesthetic: lidocaine 1% without  epinephrine Anesthetic total: 1 ml Patient sedated: no Scalpel size: 11 Incision type: single straight Complexity: simple Drainage: purulent Drainage amount: scant Wound treatment: wound left open Patient tolerance: Patient tolerated the procedure well with no immediate complications Comments: Wound irrigated with normal saline.     (including critical care time)  DIAGNOSTIC STUDIES: Oxygen Saturation is 100% on room air, normal by my interpretation.    COORDINATION OF CARE: 8:06 PM Discussed treatment plan with pt at bedside and pt agreed to plan.    MDM   Final diagnoses:  Facial abscess    DC home with pain meds and doxy.  Warm compresses.  Follow up with PCP in a few days for recheck I personally performed the services described in this documentation, which was scribed in my presence.  The recorded information has been reviewed and is accurate.   Dorie Rank, MD 03/16/15 [phone]

## 2015-03-16 NOTE — Discharge Instructions (Signed)

## 2015-03-16 NOTE — ED Notes (Signed)
Boil on lt lower cheek appeared on Wednesday, increased in size.

## 2015-03-17 ENCOUNTER — Ambulatory Visit: Payer: Self-pay | Admitting: Nurse Practitioner

## 2015-04-26 IMAGING — CT CT HEAD W/O CM
1 of 2 series · 16 of 30 positions shown, 20 images · non-contrast
Comparison: None.

CLINICAL DATA: Seizure.

EXAM:
CT HEAD WITHOUT CONTRAST
TECHNIQUE: Contiguous axial images were obtained from the base of the skull
through the vertex without intravenous contrast.

[Series 2: head 4.8 h37s · axial · 0.42mm/px · z∈[+1372,+1509]mm · 16 of 32 slices shown, 20 images]
[im 2/32  brain]
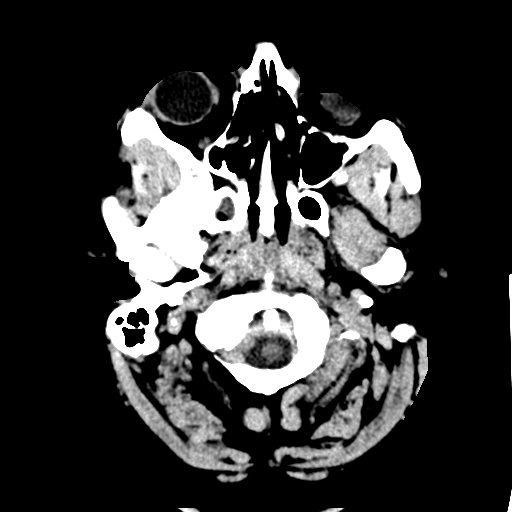
[im 2/32  bone]
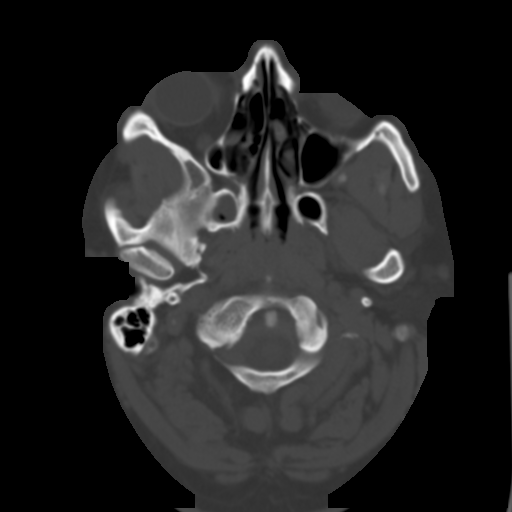
[im 3/32  brain]
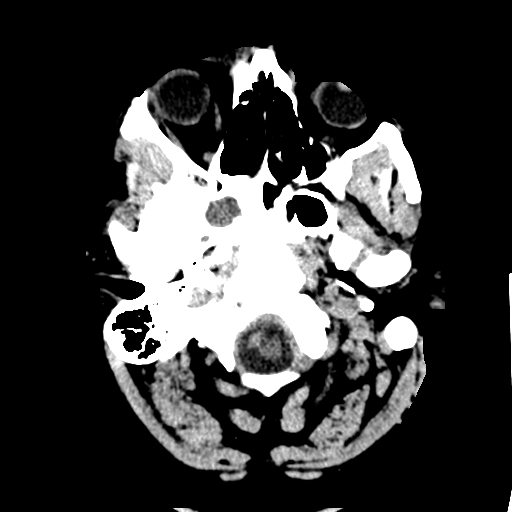
[im 6/32  brain]
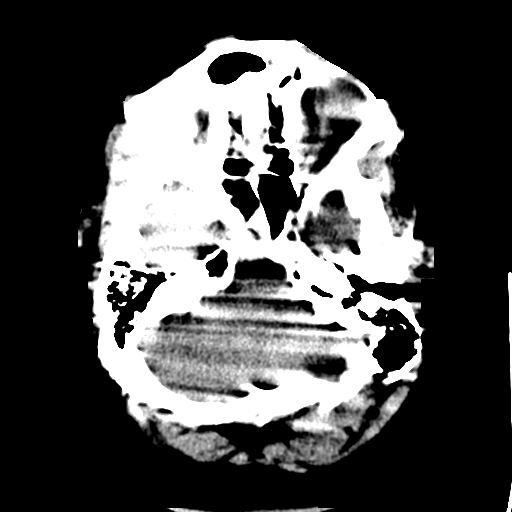
[im 8/32  brain]
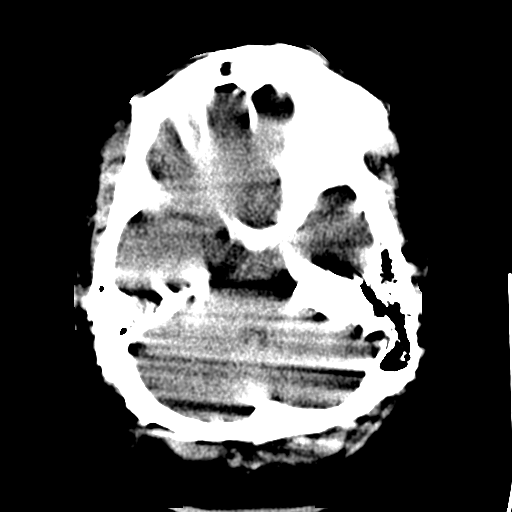
[im 9/32  brain]
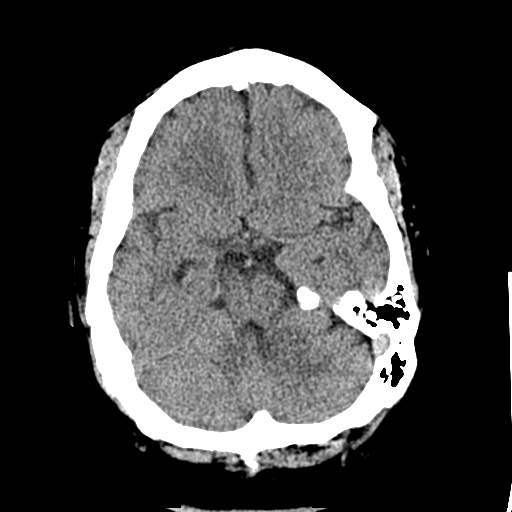
[im 9/32  bone]
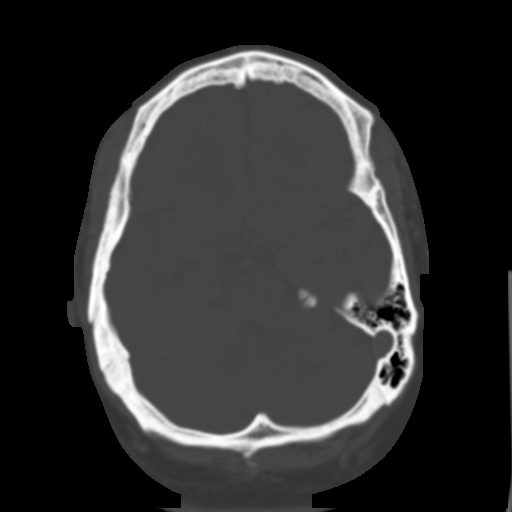
[im 11/32  brain]
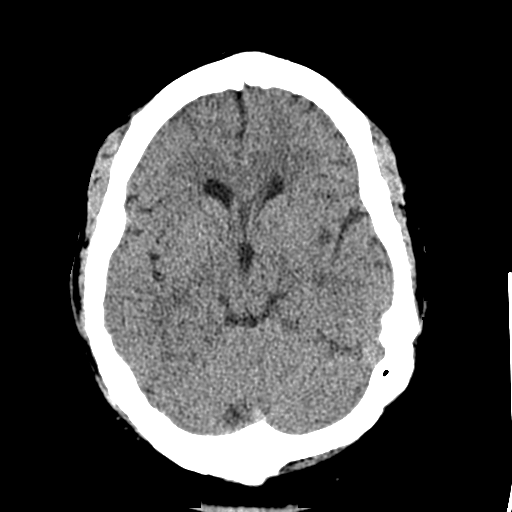
[im 14/32  brain]
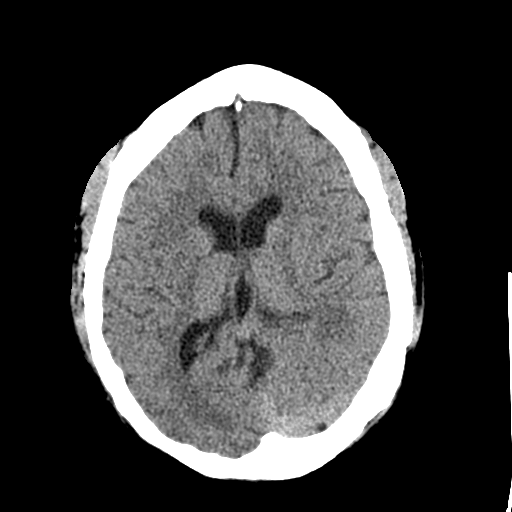
[im 15/32  brain]
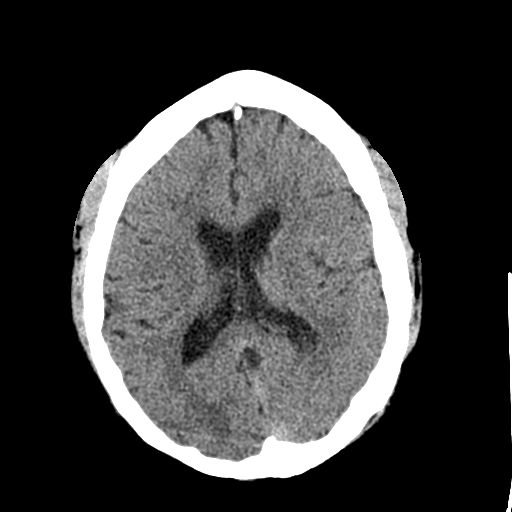
[im 17/32  brain]
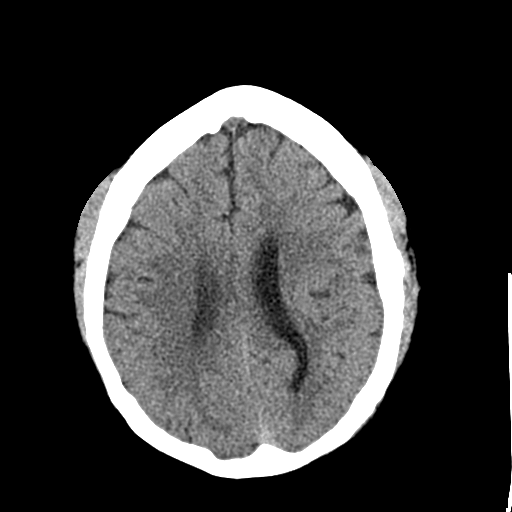
[im 17/32  bone]
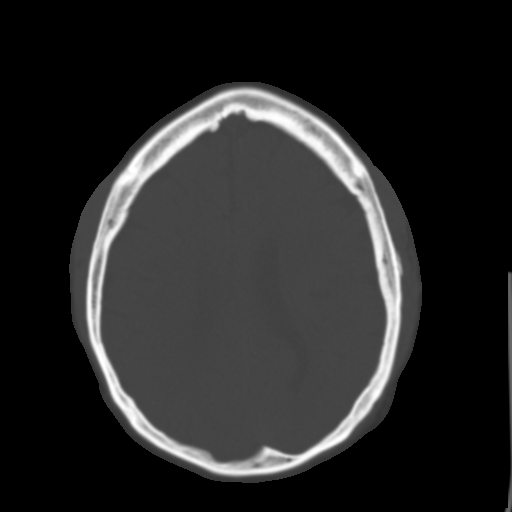
[im 18/32  brain]
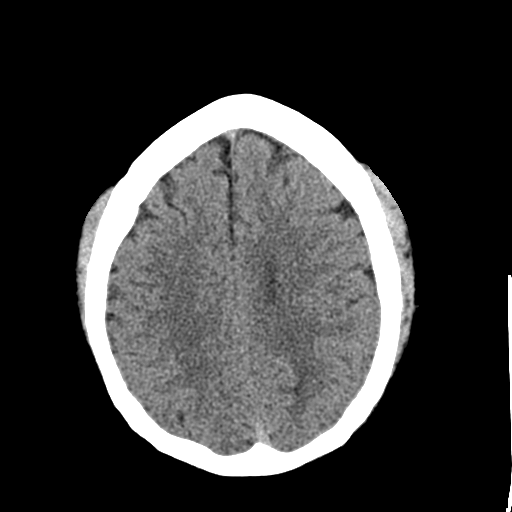
[im 21/32  brain]
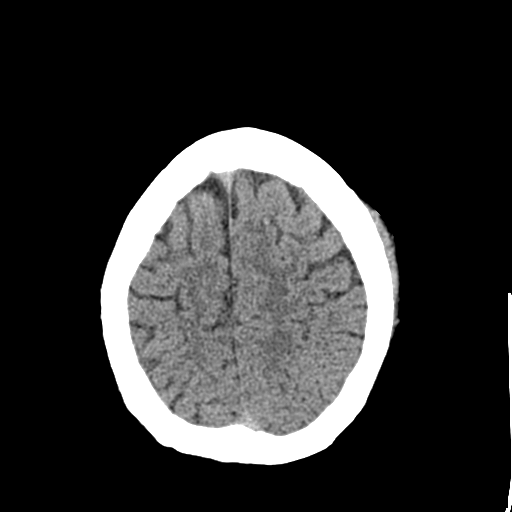
[im 23/32  brain]
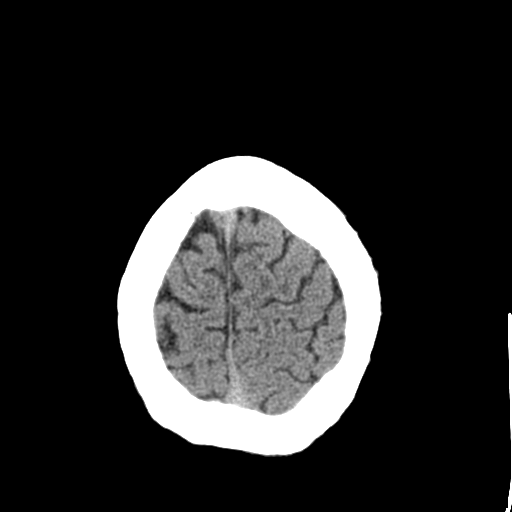
[im 24/32  brain]
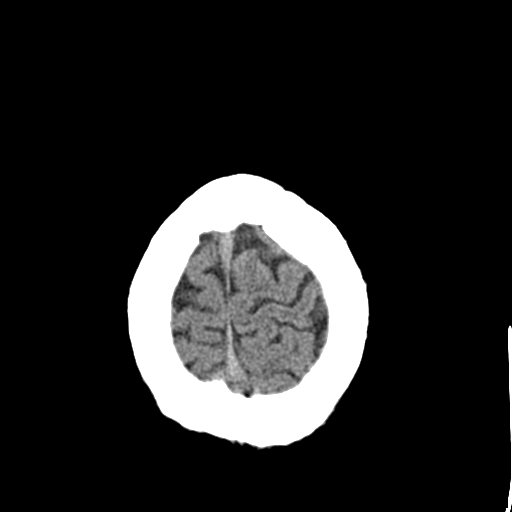
[im 24/32  bone]
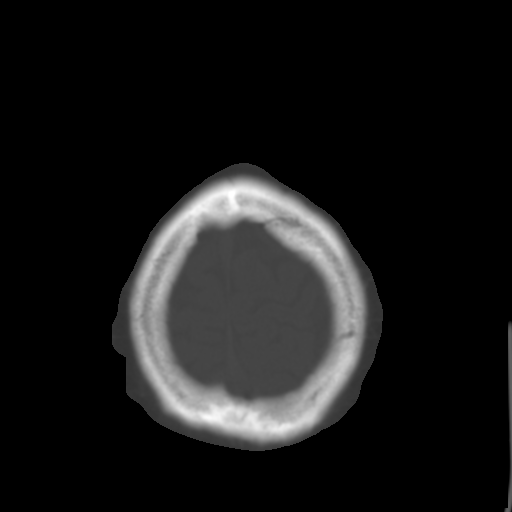
[im 26/32  brain]
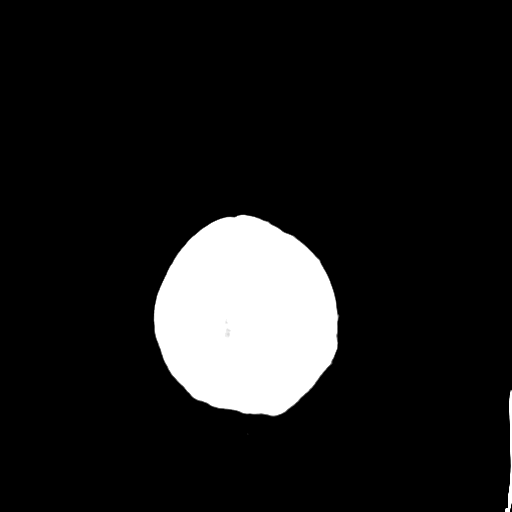
[im 29/32  brain]
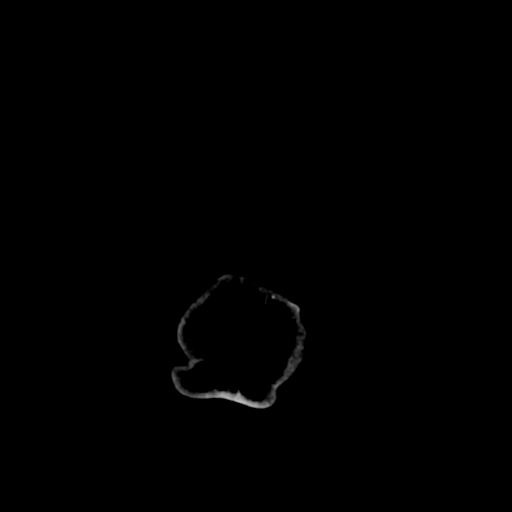
[im 30/32  brain]
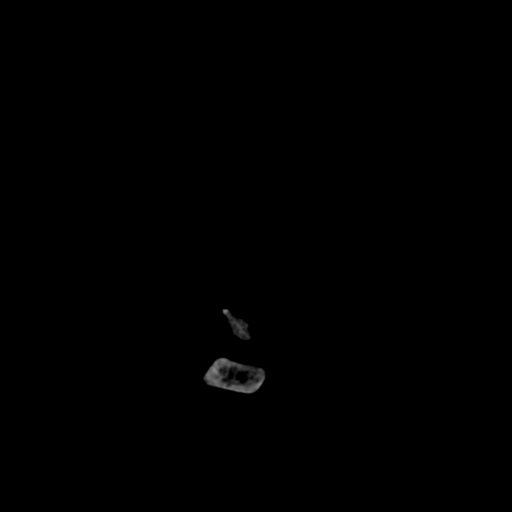

[16 of 30 positions shown; findings below may reference images not displayed]

FINDINGS: No mass. No hydrocephalus. No hemorrhage. No acute bony abnormality.
Mucosal thickening in the ethmoidal and sphenoid sinuses projection
prominent in the right aspect of the sphenoid sinus. Severe streak
artifact is present.
IMPRESSION: 1. Sphenoid sinusitis .
2. No acute intracranial abnormality otherwise noted.

## 2015-04-30 ENCOUNTER — Encounter: Payer: Self-pay | Admitting: Nurse Practitioner

## 2015-04-30 ENCOUNTER — Ambulatory Visit (INDEPENDENT_AMBULATORY_CARE_PROVIDER_SITE_OTHER): Payer: Self-pay | Admitting: Nurse Practitioner

## 2015-04-30 ENCOUNTER — Encounter (INDEPENDENT_AMBULATORY_CARE_PROVIDER_SITE_OTHER): Payer: Self-pay

## 2015-04-30 VITALS — BP 129/73 | HR 89 | Ht 69.0 in | Wt 235.8 lb

## 2015-04-30 DIAGNOSIS — R51 Headache: Secondary | ICD-10-CM

## 2015-04-30 DIAGNOSIS — R519 Headache, unspecified: Secondary | ICD-10-CM

## 2015-04-30 DIAGNOSIS — Z5181 Encounter for therapeutic drug level monitoring: Secondary | ICD-10-CM

## 2015-04-30 DIAGNOSIS — G40309 Generalized idiopathic epilepsy and epileptic syndromes, not intractable, without status epilepticus: Secondary | ICD-10-CM

## 2015-04-30 MED ORDER — TOPIRAMATE 25 MG PO TABS: 100.0000 mg | ORAL_TABLET | Freq: Two times a day (BID) | ORAL | Status: DC

## 2015-04-30 NOTE — Patient Instructions (Signed)
Continue Carbatrol at current dose will refill Continue Topamax at current dose will refill Will check labs today Follow-up yearly and when necessary

## 2015-04-30 NOTE — Progress Notes (Signed)
[facility]  PATIENT: Luis Alexander DOB: 02/20/1981   REASON FOR VISIT: follow-up for generalized epilepsy and  episodic headache HISTORY FROM:patient    HISTORY OF PRESENT ILLNESS:Mr. Luis Alexander, 35 year old male returns for followup. He was last seen in this office 09/12/14. He has a history of generalized seizure disorder and is currently on Carbatrol and Topamax. Last  seizure event on 09/09/2014 while at home, found himself lying on floor. He went to the emergency room on 09/11/14 because of continued headache in the frontal area associated with some blurred vision and photophobia. He was given a migraine cocktail which resolved his headache. CT of the head was unremarkable. Carbamazepine level was 10.5 He is also on Topamax 50 mg twice daily He has had poor medical followup in the past. He does not have insurance. He is alone at visit. He gets his Carbatrol through patient assistance. He returns for reevaluation.     REVIEW OF SYSTEMS: Full 14 system review of systems performed and notable only for those listed, all others are neg:  Constitutional: neg  Cardiovascular: neg Ear/Nose/Throat: neg  Skin: neg Eyes: neg Respiratory: neg Gastroitestinal: neg  Hematology/Lymphatic: neg  Endocrine: neg Musculoskeletal:neg Allergy/Immunology: neg Neurological: neg Psychiatric: neg Sleep : neg   ALLERGIES: No Known Allergies  HOME MEDICATIONS: Outpatient Prescriptions Prior to Visit  Medication Sig Dispense Refill  . CARBATROL 200 MG 12 hr capsule Take 2 capsules by mouth every morning, and 3 capsules at bedtime 50 capsule 2  . topiramate (TOPAMAX) 25 MG tablet Take 4 tablets (100 mg total) by mouth 2 (two) times daily. 240 tablet 6  . doxycycline (VIBRA-TABS) 100 MG tablet Take 1 tablet (100 mg total) by mouth 2 (two) times daily. 14 tablet 0  . HYDROcodone-acetaminophen (NORCO/VICODIN) 5-325 MG tablet Take 1 tablet by mouth every 4 (four) hours as needed  for moderate pain. 12 tablet 0  . naproxen (NAPROSYN) 500 MG tablet Take 1 tablet (500 mg total) by mouth 2 (two) times daily with a meal. As needed for pain 20 tablet 0  . predniSONE (DELTASONE) 10 MG tablet Take 1 tablet (10 mg total) by mouth daily with breakfast. 6 day dose pack 21 tablet 0   No facility-administered medications prior to visit.    PAST MEDICAL HISTORY: Past Medical History  Diagnosis Date  . Obesity   . Seizures ([place])     last seizure 09/09/14    PAST SURGICAL HISTORY: Past Surgical History  Procedure Laterality Date  . Elbow surgery    . Knee surgery      FAMILY HISTORY: Family History  Problem Relation Age of Onset  . Cancer Father     SOCIAL HISTORY: Social History   Social History  . Marital Status: Single    Spouse Name: N/A  . Number of Children: 0  . Years of Education: 12   Occupational History  . [employer]    Social History Main Topics  . Smoking status: Current Every Day Smoker -- 1.00 packs/day    Types: Cigarettes  . Smokeless tobacco: Never Used  . Alcohol Use: 0.0 oz/week    0 Standard drinks or equivalent per week  . Drug Use: No  . Sexual Activity: Not on file   Other Topics Concern  . Not on file   Social History Narrative   Patient is single with no children   Patient is left handed   Patient has a high school education   Patient drinks 4 cups daily  PHYSICAL EXAM  Filed Vitals:   04/30/15 1547  BP: 129/73  Pulse: 89  Height: 5\' 9"  (1.753 m)  Weight: 235 lb 12.8 oz (106.958 kg)   Body mass index is 34.81 kg/(m^2). General: well developed, obese male seated, in no evident distress  Neurologic Exam  Mental Status: Awake and fully alert. Oriented to place and time. Follows all commands. Speech and language normal.  Cranial Nerves: Pupils equal, briskly reactive to light. Extraocular movements full without nystagmus. Visual fields full to confrontation. Hearing intact and symmetric to finger snap.  Facial sensation intact. Face, tongue, palate move normally and symmetrically. Neck flexion and extension normal.  Motor: Normal bulk and tone. Normal strength in all tested extremity muscles.No focal weakness  Coordination: Rapid alternating movements normal in all extremities. Finger-to-nose and heel-to-shin performed accurately bilaterally.  Gait and Station: Arises from chair without difficulty. Stance is normal. Gait demonstrates normal stride length and balance . Able to heel, toe and tandem walk without difficulty.  Reflexes: 1+ and symmetric. Toes downgoing.    DIAGNOSTIC DATA (LABS, IMAGING, TESTING) - I reviewed patient records, labs, notes, testing and imaging myself where available.  Lab Results  Component Value Date   WBC 8.0 09/11/2014   HGB 13.9 09/11/2014   HCT 42.6 09/11/2014   MCV 95.9 09/11/2014   PLT 244 09/11/2014      Component Value Date/Time   NA 140 09/11/2014 0935   NA 142 09/28/2013 1611   K 4.0 09/11/2014 0935   CL 113* 09/11/2014 0935   CO2 23 09/11/2014 0935   GLUCOSE 95 09/11/2014 0935   GLUCOSE 86 09/28/2013 1611   BUN 15 09/11/2014 0935   BUN 16 09/28/2013 1611   CREATININE 0.80 09/11/2014 0935   CALCIUM 8.9 09/11/2014 0935   PROT 6.6 09/11/2014 0935   PROT 7.2 09/28/2013 1611   ALBUMIN 4.1 09/11/2014 0935   ALBUMIN 4.8 09/28/2013 1611   AST 19 09/11/2014 0935   ALT 26 09/11/2014 0935   ALKPHOS 74 09/11/2014 0935   BILITOT 0.1* 09/11/2014 0935   GFRNONAA >60 09/11/2014 0935   GFRAA >60 09/11/2014 0935    ASSESSMENT AND PLAN 35 y.o. year old male  has a past medical history of Obesity and Seizures. here here to follow-up Last  seizure  09/09/2014. He also has a history of headaches.    Continue Carbatrol at current dose obtains thru pt assist Continue Topamax at current dose will refill Will check labs today,CBC, CMP,CBZ Follow-up yearly and when necessary Dennie Bible, [facility], [facility], APRN  [facility] [address], [place], [place] [address] [phone] A Raiford Noble she is currently into the big

## 2015-05-01 LAB — CBC WITH DIFFERENTIAL/PLATELET
Basophils Absolute: 0 10*3/uL (ref 0.0–0.2)
Basos: 0 %
EOS (ABSOLUTE): 0.2 10*3/uL (ref 0.0–0.4)
Eos: 3 %
Hematocrit: 42.8 % (ref 37.5–51.0)
Hemoglobin: 14.5 g/dL (ref 12.6–17.7)
IMMATURE GRANS (ABS): 0 10*3/uL (ref 0.0–0.1)
IMMATURE GRANULOCYTES: 1 %
LYMPHS: 30 %
Lymphocytes Absolute: 2.6 10*3/uL (ref 0.7–3.1)
MCH: 31.3 pg (ref 26.6–33.0)
MCHC: 33.9 g/dL (ref 31.5–35.7)
MCV: 92 fL (ref 79–97)
MONOS ABS: 0.6 10*3/uL (ref 0.1–0.9)
Monocytes: 7 %
NEUTROS PCT: 59 %
Neutrophils Absolute: 5.3 10*3/uL (ref 1.4–7.0)
PLATELETS: 256 10*3/uL (ref 150–379)
RBC: 4.64 x10E6/uL (ref 4.14–5.80)
RDW: 13.1 % (ref 12.3–15.4)
WBC: 8.8 10*3/uL (ref 3.4–10.8)

## 2015-05-01 LAB — COMPREHENSIVE METABOLIC PANEL
ALT: 23 IU/L (ref 0–44)
AST: 18 IU/L (ref 0–40)
Albumin/Globulin Ratio: 2.2 (ref 1.1–2.5)
Albumin: 4.4 g/dL (ref 3.5–5.5)
Alkaline Phosphatase: 88 IU/L (ref 39–117)
BUN/Creatinine Ratio: 18 (ref 8–19)
BUN: 16 mg/dL (ref 6–20)
CHLORIDE: 106 mmol/L (ref 96–106)
CO2: 22 mmol/L (ref 18–29)
Calcium: 9.6 mg/dL (ref 8.7–10.2)
Creatinine, Ser: 0.87 mg/dL (ref 0.76–1.27)
GFR calc Af Amer: 131 mL/min/{1.73_m2} (ref >59)
GFR calc non Af Amer: 113 mL/min/{1.73_m2} (ref >59)
GLUCOSE: 117 mg/dL — AB (ref 65–99)
Globulin, Total: 2 g/dL (ref 1.5–4.5)
Potassium: 4.3 mmol/L (ref 3.5–5.2)
Sodium: 144 mmol/L (ref 134–144)
Total Protein: 6.4 g/dL (ref 6.0–8.5)

## 2015-05-01 LAB — CARBAMAZEPINE LEVEL, TOTAL: CARBAMAZEPINE LVL: 9.2 ug/mL (ref 4.0–12.0)

## 2015-05-01 NOTE — Progress Notes (Signed)
I have reviewed and agreed above plan. 

## 2015-05-02 ENCOUNTER — Telehealth: Payer: Self-pay | Admitting: *Deleted

## 2015-05-02 NOTE — Telephone Encounter (Signed)
-----   Message from Dennie Bible, NP sent at 05/01/2015 10:24 AM EST ----- Labs ok please call the patient

## 2015-05-02 NOTE — Telephone Encounter (Signed)
Not able to LM.  Will try again later.

## 2015-05-05 ENCOUNTER — Encounter: Payer: Self-pay | Admitting: *Deleted

## 2015-05-05 NOTE — Telephone Encounter (Signed)
Mailed result letter

## 2015-05-27 ENCOUNTER — Telehealth: Payer: Self-pay | Admitting: *Deleted

## 2015-06-04 NOTE — Telephone Encounter (Signed)
I mailed his lab results again.

## 2015-06-24 ENCOUNTER — Ambulatory Visit: Payer: Self-pay | Admitting: Nurse Practitioner

## 2015-06-25 ENCOUNTER — Telehealth: Payer: Self-pay

## 2015-06-25 MED ORDER — TOPIRAMATE 25 MG PO TABS: 100.0000 mg | ORAL_TABLET | Freq: Two times a day (BID) | ORAL | Status: DC

## 2015-06-25 NOTE — Telephone Encounter (Signed)
Received a notice from [facility] in [facility] requesting a refill on pt's topamax. However, the topamax was filled on 04/30/2015 with 11 refills and sent to the [facility] in [facility].  I called pt to find out which he is going to use. No answer, left a message asking him to call me back

## 2015-06-25 NOTE — Telephone Encounter (Signed)
Pt called back and said [facility]. I will [facility] the [facility] and call [facility] to cancel the topamax order sent to them in January.

## 2015-08-19 ENCOUNTER — Telehealth: Payer: Self-pay | Admitting: Nurse Practitioner

## 2015-08-19 NOTE — Telephone Encounter (Signed)
Patient requesting refill of CARBATROL 200 MG 12 hr capsule Pharmacy: [phone], fax : [phone] Pt's mother called in request. Please call her back per request

## 2015-08-19 NOTE — Telephone Encounter (Signed)
I called and spoke to nikiria, at [facility] at the number below.  She stated that a refill was placed already and should be delivered to pt at 08-22-15.  I relayed this to mother of pt.   She will call tomorrow and just check.   She sometimes gets conflicting information.  She is to call back if problems.

## 2015-10-09 ENCOUNTER — Emergency Department (HOSPITAL_BASED_OUTPATIENT_CLINIC_OR_DEPARTMENT_OTHER)
Admission: EM | Admit: 2015-10-09 | Discharge: 2015-10-09 | Disposition: A | Discharge status: Home / Self Care | Payer: Self-pay | Attending: Emergency Medicine | Admitting: Emergency Medicine

## 2015-10-09 ENCOUNTER — Encounter (HOSPITAL_BASED_OUTPATIENT_CLINIC_OR_DEPARTMENT_OTHER): Payer: Self-pay | Admitting: *Deleted

## 2015-10-09 DIAGNOSIS — F17210 Nicotine dependence, cigarettes, uncomplicated: Secondary | ICD-10-CM | POA: Insufficient documentation

## 2015-10-09 DIAGNOSIS — R21 Rash and other nonspecific skin eruption: Secondary | ICD-10-CM | POA: Insufficient documentation

## 2015-10-09 DIAGNOSIS — R238 Other skin changes: Secondary | ICD-10-CM

## 2015-10-09 DIAGNOSIS — D229 Melanocytic nevi, unspecified: Secondary | ICD-10-CM

## 2015-10-09 DIAGNOSIS — Z6836 Body mass index (BMI) 36.0-36.9, adult: Secondary | ICD-10-CM | POA: Insufficient documentation

## 2015-10-09 DIAGNOSIS — D224 Melanocytic nevi of scalp and neck: Secondary | ICD-10-CM | POA: Insufficient documentation

## 2015-10-09 DIAGNOSIS — E669 Obesity, unspecified: Secondary | ICD-10-CM | POA: Insufficient documentation

## 2015-10-09 DIAGNOSIS — D225 Melanocytic nevi of trunk: Secondary | ICD-10-CM | POA: Insufficient documentation

## 2015-10-09 MED ORDER — TRIAMCINOLONE ACETONIDE 0.1 % EX CREA: 1.0000 "application " | TOPICAL_CREAM | Freq: Two times a day (BID) | CUTANEOUS | Status: DC

## 2015-10-09 NOTE — Discharge Instructions (Signed)
Take the prescribed medication as directed. Follow-up with dermatologist for further evaluation of moles and scalp irritation. Call Dr. Ledell Peoples office to make appointment. May also wish to follow-up with the [facility]. Return to the ED for new or worsening symptoms.

## 2015-10-09 NOTE — ED Provider Notes (Signed)
CSN: [id number]     Arrival date & time 10/09/15  1946 History  By signing my name below, I, Rayna Sexton, attest that this documentation has been prepared under the direction and in the presence of Larene Pickett, PA-C. Electronically Signed: Rayna Sexton, ED Scribe. 10/09/2015. 9:28 PM.   Chief Complaint  Patient presents with  . Rash   The history is provided by the patient and the spouse. No language interpreter was used.    HPI Comments: Luis Alexander is a 35 y.o. male who presents to the Emergency Department complaining of a scalp rash which has been present for several years.  States has become more pruritic over the past few days. Pt reports associated, mild, redness, itchiness and irritation across the affected region.   Patient also has multiple moles to his scalp, neck and upper back which he is concerned about. He has applied hydrocortisone cream and Retin-A Micro without significant relief. He uses Head and Shoulders shampoo on his scalp daily. Pt denies having current medical coverage and has not been seen by a dermatologist for his symptoms. He denies any other associated symptoms at this time.  No fever, chills, sweats.  No hx of skin CA in the past.  VSS.  Past Medical History  Diagnosis Date  . Obesity   . Seizures ([place])     last seizure 09/09/14   Past Surgical History  Procedure Laterality Date  . Elbow surgery    . Knee surgery     Family History  Problem Relation Age of Onset  . Cancer Father    Social History  Substance Use Topics  . Smoking status: Current Every Day Smoker -- 1.00 packs/day    Types: Cigarettes  . Smokeless tobacco: Never Used  . Alcohol Use: 0.0 oz/week    0 Standard drinks or equivalent per week    Review of Systems  Constitutional: Negative for fever and chills.  Skin: Positive for color change and rash.  All other systems reviewed and are negative.   Allergies  Review of patient's allergies indicates no known  allergies.  Home Medications   Prior to Admission medications   Medication Sig Start Date End Date Taking? Authorizing Provider  CARBATROL 200 MG 12 hr capsule Take 2 capsules by mouth every morning, and 3 capsules at bedtime 12/12/13   Marcial Pacas, MD  topiramate (TOPAMAX) 25 MG tablet Take 4 tablets (100 mg total) by mouth 2 (two) times daily. 06/25/15   Dennie Bible, NP   BP 105/78 mmHg  Pulse 82  Temp(Src) 97.9 F (36.6 C) (Oral)  Resp 16  Ht 5\' 8"  (1.727 m)  Wt 240 lb (108.863 kg)  BMI 36.50 kg/m2  SpO2 99%    Physical Exam  Constitutional: He is oriented to person, place, and time. He appears well-developed and well-nourished.  HENT:  Head: Normocephalic and atraumatic.  Mouth/Throat: Oropharynx is clear and moist.  Occipital scalp appears chronically irritated, scalp is somewhat dry, scars noted which appear related to acne; no drainage or abscess formation, no signs of cellulitis  Eyes: Conjunctivae and EOM are normal. Pupils are equal, round, and reactive to light.  Neck: Normal range of motion.  Cardiovascular: Normal rate, regular rhythm and normal heart sounds.   Pulmonary/Chest: Effort normal and breath sounds normal. No respiratory distress.  Abdominal: Soft. Bowel sounds are normal.  Musculoskeletal: Normal range of motion.  Neurological: He is alert and oriented to person, place, and time.  Skin: Skin is warm  and dry.  Multiple moles noted to neck and back, various sizes and shapes, no areas of necrosis or surrounding skin discoloration, 2 small skin tags noted to right side of neck as well  Psychiatric: He has a normal mood and affect.  Nursing note and vitals reviewed.   ED Course  Procedures  DIAGNOSTIC STUDIES: Oxygen Saturation is 99% on RA, normal by my interpretation.    COORDINATION OF CARE: 9:21 PM Discussed next steps with pt. Pt verbalized understanding and is agreeable with the plan.   Labs Review Labs Reviewed - No data to  display  Imaging Review No results found.   EKG Interpretation None      MDM   Final diagnoses:  Scalp irritation  Numerous moles   35 year old male here with scalp irritation which has been present for several years. He is afebrile, nontoxic. His occipital scalp does appear chronically irritated and somewhat dry. There is no drainage or abscess formation. No signs of cellulitis at this time. States this area is pruritic. He does use dry scalp shampoo. Will start trial of Kenalog. Patient also concerned about several moles on his neck and back. He does have a few which are in various sizes and shapes. There is no areas of skin necrosis or overt skin discoloration. He does have 2 small skin tags on the right side of his neck as well. Will refer to dermatology for both aforementioned complaints.  Discussed plan with patient, he/she acknowledged understanding and agreed with plan of care.  Return precautions given for new or worsening symptoms.  I personally performed the services described in this documentation, which was scribed in my presence. The recorded information has been reviewed and is accurate.  Larene Pickett, PA-C 10/09/15 [place], MD 10/11/15 [phone]

## 2015-10-09 NOTE — ED Notes (Signed)
Pt c/o rash to scalp and back x 3 days

## 2015-10-29 ENCOUNTER — Ambulatory Visit (INDEPENDENT_AMBULATORY_CARE_PROVIDER_SITE_OTHER): Payer: Self-pay | Admitting: Nurse Practitioner

## 2015-10-29 ENCOUNTER — Encounter: Payer: Self-pay | Admitting: *Deleted

## 2015-10-29 ENCOUNTER — Encounter: Payer: Self-pay | Admitting: Nurse Practitioner

## 2015-10-29 VITALS — BP 104/72 | HR 73 | Ht 68.0 in | Wt 258.2 lb

## 2015-10-29 DIAGNOSIS — G253 Myoclonus: Secondary | ICD-10-CM

## 2015-10-29 DIAGNOSIS — G40309 Generalized idiopathic epilepsy and epileptic syndromes, not intractable, without status epilepticus: Secondary | ICD-10-CM

## 2015-10-29 MED ORDER — TOPIRAMATE 25 MG PO TABS: 100.0000 mg | ORAL_TABLET | Freq: Two times a day (BID) | ORAL | 11 refills | Status: DC

## 2015-10-29 NOTE — Patient Instructions (Signed)
Pt here for appt.  Needed to reapply for pt assistance on his carbatrol.  [employer].  Form completed on our end with signature by CM/NP.  Pt to fill out on his end and will fax or mail.  If anything more will contact us.  [phone], fax  [phone].

## 2015-10-29 NOTE — Progress Notes (Signed)
[facility]  PATIENT: Luis Alexander DOB: 10-May-1980   REASON FOR VISIT: follow-up for generalized epilepsy , headaches HISTORY FROM:patient    HISTORY OF PRESENT ILLNESS:Mr. Luis Alexander, 35 year old male returns for followup. He was last seen in this office 04/30/15.  He has a history of generalized seizure disorder and is currently on Carbatrol and Topamax. Last  seizure event on 09/09/2014 while at home, found himself lying on floor. He went to the emergency room on 09/11/14 because of continued headache in the frontal area associated with some blurred vision and photophobia. He was given a migraine cocktail which resolved his headache. CT of the head was unremarkable. Carbamazepine level was 10.5 He is also on Topamax 100 mg twice daily He has had poor medical followup in the past. He does not have insurance.  He gets his Carbatrol through patient assistance and needs to renew.  He returns for reevaluation.     REVIEW OF SYSTEMS: Full 14 system review of systems performed and notable only for those listed, all others are neg:  Constitutional: neg  Cardiovascular: neg Ear/Nose/Throat: neg  Skin: Moles Eyes: neg Respiratory: neg Gastroitestinal: neg  Hematology/Lymphatic: neg  Endocrine: neg Musculoskeletal:neg Allergy/Immunology: neg Neurological: neg Psychiatric: neg Sleep : neg   ALLERGIES: No Known Allergies  HOME MEDICATIONS: Outpatient Medications Prior to Visit  Medication Sig Dispense Refill  . CARBATROL 200 MG 12 hr capsule Take 2 capsules by mouth every morning, and 3 capsules at bedtime 50 capsule 2  . topiramate (TOPAMAX) 25 MG tablet Take 4 tablets (100 mg total) by mouth 2 (two) times daily. 240 tablet 11  . triamcinolone cream (KENALOG) 0.1 % Apply 1 application topically 2 (two) times daily. 30 g 0   No facility-administered medications prior to visit.     PAST MEDICAL HISTORY: Past Medical History:  Diagnosis Date  . Obesity   .  Seizures ([place])    last seizure 09/09/14    PAST SURGICAL HISTORY: Past Surgical History:  Procedure Laterality Date  . ELBOW SURGERY    . KNEE SURGERY      FAMILY HISTORY: Family History  Problem Relation Age of Onset  . Cancer Father     SOCIAL HISTORY: Social History   Social History  . Marital status: Single    Spouse name: N/A  . Number of children: 0  . Years of education: 12   Occupational History  . [employer]   Social History Main Topics  . Smoking status: Current Every Day Smoker    Packs/day: 1.00    Types: Cigarettes  . Smokeless tobacco: Never Used  . Alcohol use 0.0 oz/week  . Drug use: No  . Sexual activity: Not on file   Other Topics Concern  . Not on file   Social History Narrative   Patient is single with no children   Patient is left handed   Patient has a high school education   Patient drinks 4 cups daily           PHYSICAL EXAM  Vitals:   10/29/15 1602  BP: 104/72  Pulse: 73  Weight: 258 lb 3.2 oz (117.1 kg)  Height: 5\' 8"  (1.727 m)   Body mass index is 39.26 kg/m. General: well developed, obese male seated, in no evident distress  Neurologic Exam  Mental Status: Awake and fully alert. Oriented to place and time. Follows all commands. Speech and language normal.  Cranial Nerves: Pupils equal, briskly reactive to light. Extraocular movements full  without nystagmus. Visual fields full to confrontation. Hearing intact and symmetric to finger snap. Facial sensation intact. Face, tongue, palate move normally and symmetrically. Neck flexion and extension normal.  Motor: Normal bulk and tone. Normal strength in all tested extremity muscles.No focal weakness  Coordination: Rapid alternating movements normal in all extremities. Finger-to-nose and heel-to-shin performed accurately bilaterally.  Gait and Station: Arises from chair without difficulty. Stance is normal. Gait demonstrates normal stride length and balance .  Able to heel, toe and tandem walk without difficulty.  Reflexes: 1+ and symmetric. Toes downgoing.    DIAGNOSTIC DATA (LABS, IMAGING, TESTING) - I reviewed patient records, labs, notes, testing and imaging myself where available.  Lab Results  Component Value Date   WBC 8.8 04/30/2015   HGB 13.9 09/11/2014   HCT 42.8 04/30/2015   MCV 92 04/30/2015   PLT 256 04/30/2015      Component Value Date/Time   NA 144 04/30/2015 1614   K 4.3 04/30/2015 1614   CL 106 04/30/2015 1614   CO2 22 04/30/2015 1614   GLUCOSE 117 (H) 04/30/2015 1614   GLUCOSE 95 09/11/2014 0935   BUN 16 04/30/2015 1614   CREATININE 0.87 04/30/2015 1614   CALCIUM 9.6 04/30/2015 1614   PROT 6.4 04/30/2015 1614   ALBUMIN 4.4 04/30/2015 1614   AST 18 04/30/2015 1614   ALT 23 04/30/2015 1614   ALKPHOS 88 04/30/2015 1614   BILITOT <0.2 04/30/2015 1614   GFRNONAA 113 04/30/2015 1614   GFRAA 131 04/30/2015 1614    ASSESSMENT AND PLAN 35 y.o. year old male  has a past medical history of Obesity and Seizures. here here to follow-up Last  seizure  09/09/2014. He also has a history of headaches in good control. The patient is a current patient of Dr. Krista Blue who is out of the office today . This note is sent to the work in doctor.   .    Continue Carbatrol at current dose obtains thru pt assist given application for renewal. Continue Topamax at current dose will refill Call for any seizure activity Follow-up yearly and when necessary Dennie Bible, [facility], [facility], [facility] [address], [place], [place] [address] [phone] A Raiford Noble she is currently into the big

## 2015-10-29 NOTE — Progress Notes (Signed)
I reviewed above note and agree with the assessment and plan.  Rosalin Hawking, MD PhD Stroke Neurology 10/29/2015 6:40 PM

## 2015-10-29 NOTE — Patient Instructions (Signed)
Continue Carbatrol at current dose obtains thru pt assist Continue Topamax at current dose will refill Call for seizure activity Follow-up yearly and when necessary

## 2016-02-04 ENCOUNTER — Telehealth: Payer: Self-pay | Admitting: *Deleted

## 2016-02-04 NOTE — Telephone Encounter (Signed)
I spoke to [facility].   They did not receive 10-29-15 form for the carbatrol.  Asking for a new one.  I faxed the one that was done back in 10-29-15.  If this is not good enough they will be back in touch.  (this is pt assistance for his sz medication).

## 2016-04-12 IMAGING — CT CT HEAD W/O CM
2 series · 16 of 30 positions shown, 18 images · non-contrast
Comparison: 09/24/2013.

CLINICAL DATA: Headache. Seizure [REDACTED]. Frontal headache since the
seizure. Initial encounter.

EXAM:
CT HEAD WITHOUT CONTRAST
TECHNIQUE: Contiguous axial images were obtained from the base of the skull
through the vertex without intravenous contrast.

[Series 2: head 4.8 h37s · axial · 0.46mm/px · z∈[-2,+105]mm · 8 of 30 slices shown, 10 images]
[im 4/30  brain]
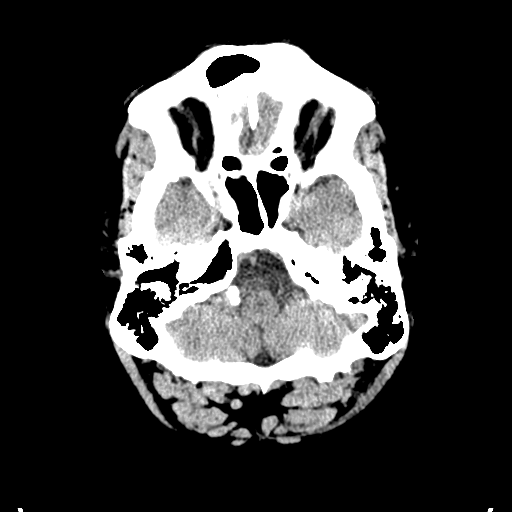
[im 4/30  bone]
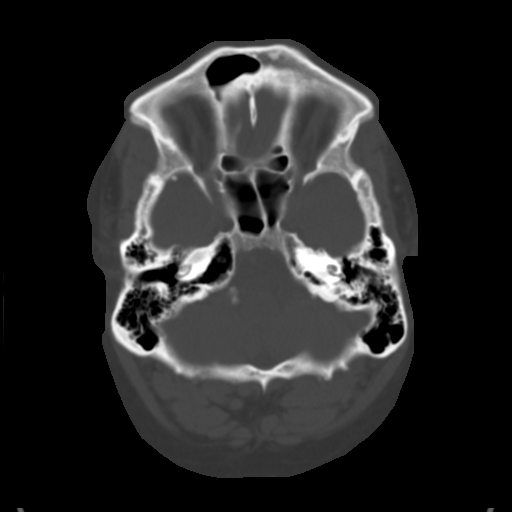
[im 7/30  brain]
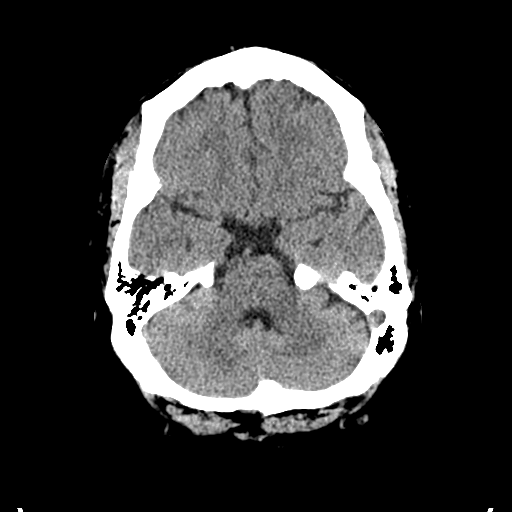
[im 10/30  brain]
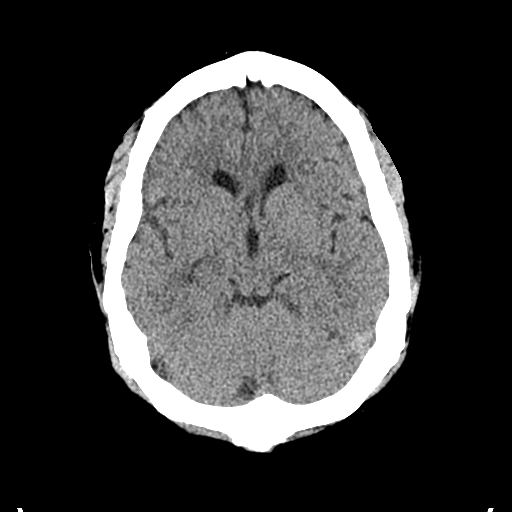
[im 13/30  brain]
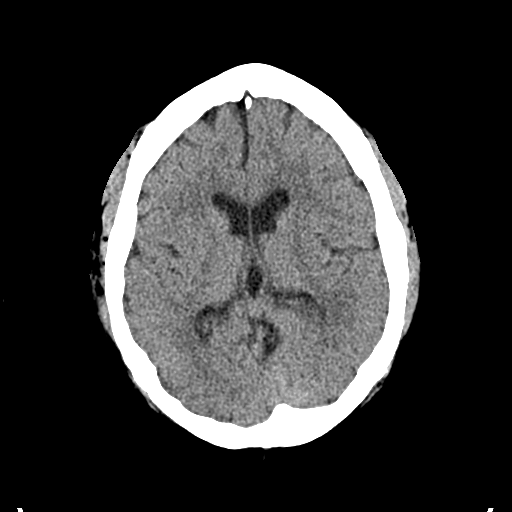
[im 17/30  brain]
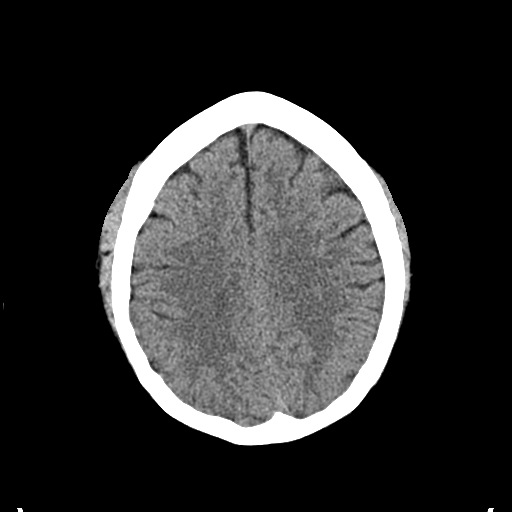
[im 17/30  bone]
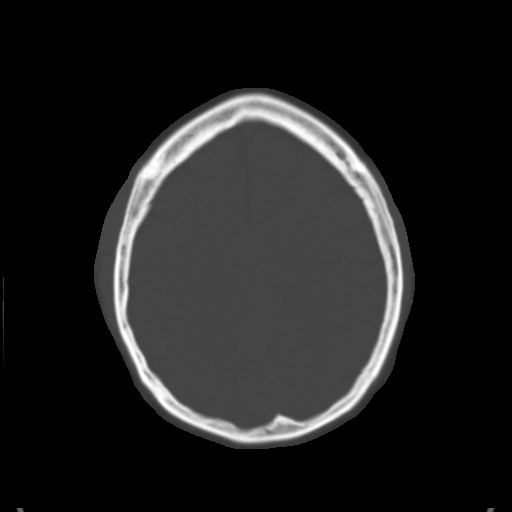
[im 20/30  brain]
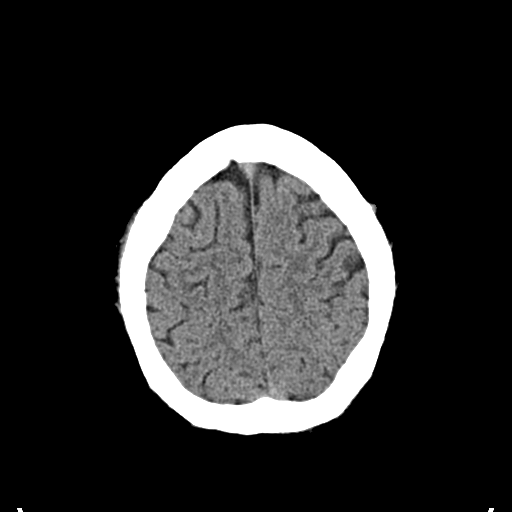
[im 23/30  brain]
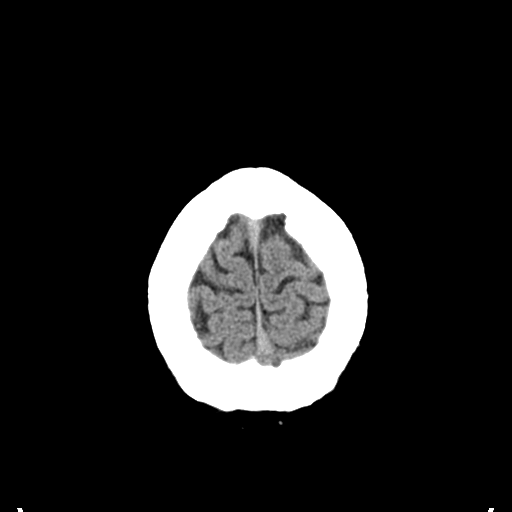
[im 26/30  brain]
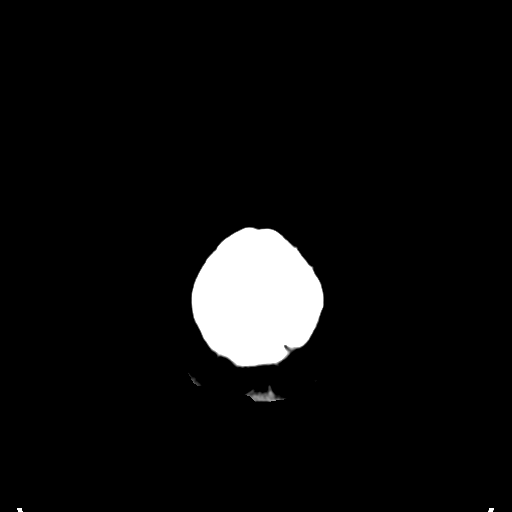

[Series 3: head 2.4 h60s bone · axial · 0.46mm/px · z∈[-3,+118]mm · 8 of 64 slices shown]
[im 7/64  bone]
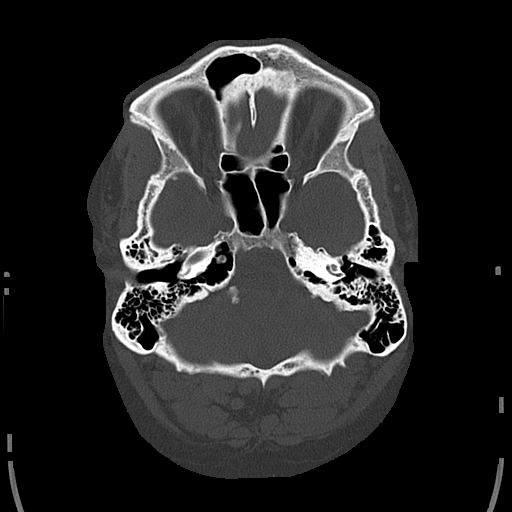
[im 14/64  bone]
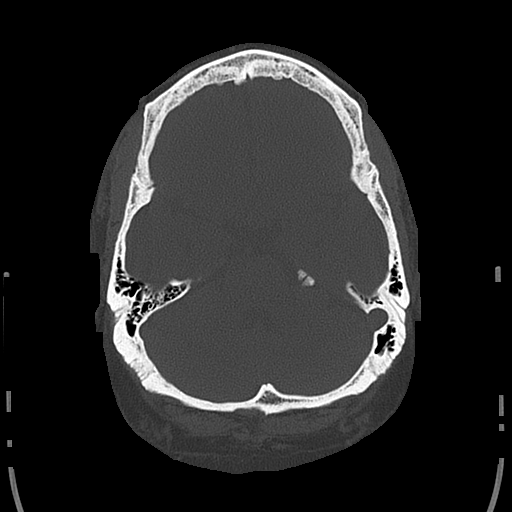
[im 20/64  bone]
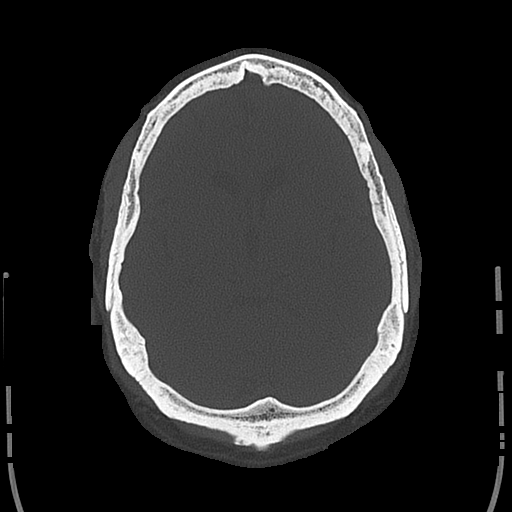
[im 27/64  bone]
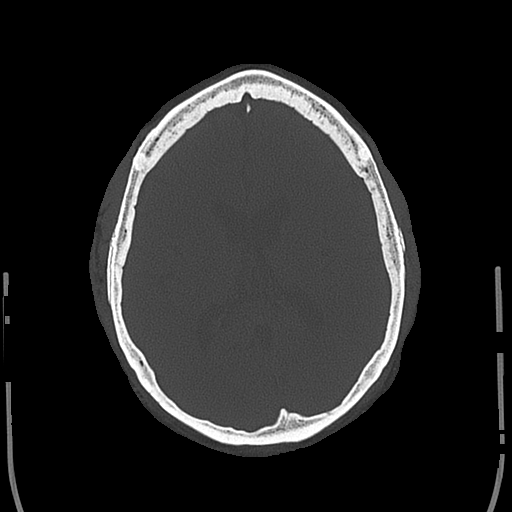
[im 37/64  bone]
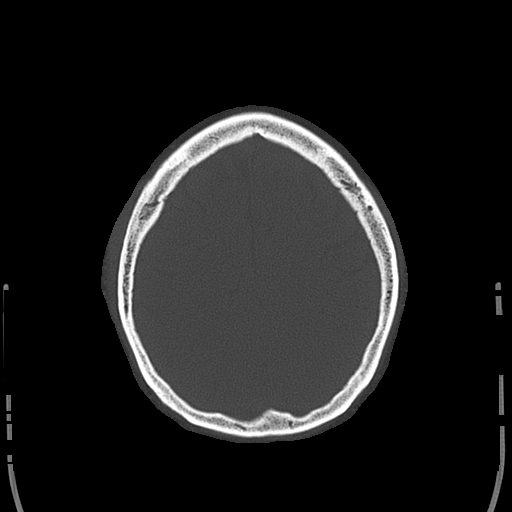
[im 44/64  bone]
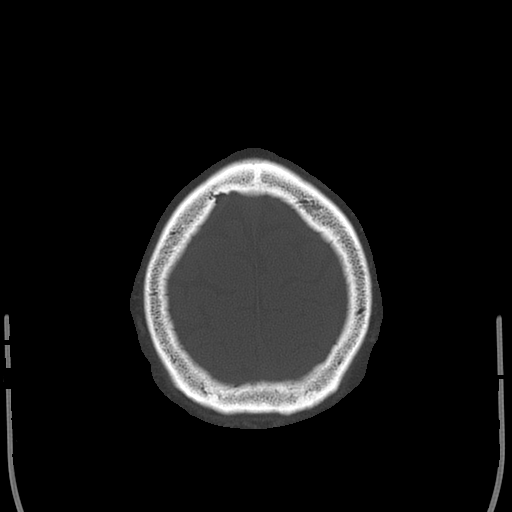
[im 50/64  bone]
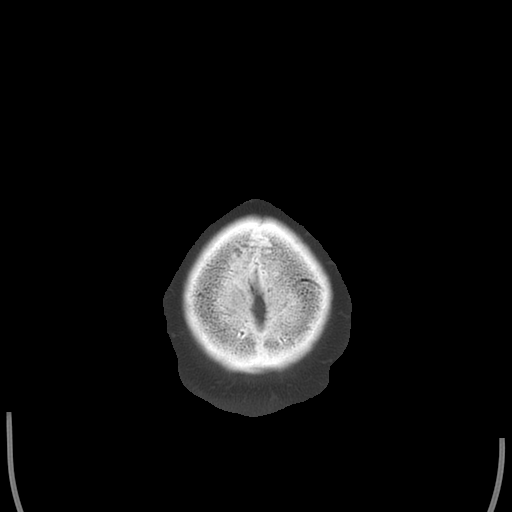
[im 57/64  bone]
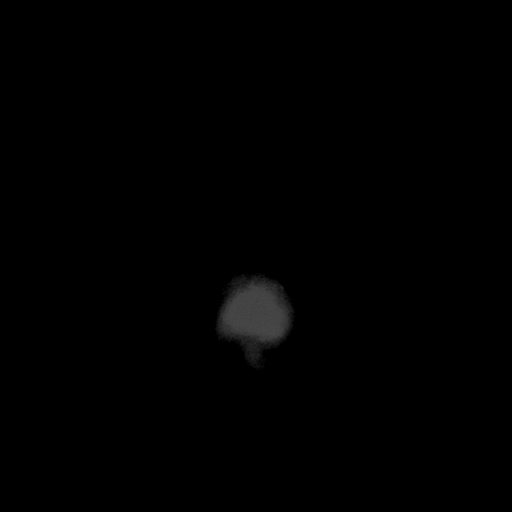

[16 of 30 positions shown; findings below may reference images not displayed]

FINDINGS: No mass lesion, mass effect, midline shift, hydrocephalus,
hemorrhage. No territorial ischemia or acute infarction. Calvarium
intact. Hypoplastic LEFT frontal sinus. Mastoid air cells clear.
Mild mucosal thickening in the sphenoid sinuses.
IMPRESSION: Negative CT head.

## 2016-11-01 ENCOUNTER — Encounter (HOSPITAL_BASED_OUTPATIENT_CLINIC_OR_DEPARTMENT_OTHER): Payer: Self-pay | Admitting: Emergency Medicine

## 2016-11-01 ENCOUNTER — Emergency Department (HOSPITAL_BASED_OUTPATIENT_CLINIC_OR_DEPARTMENT_OTHER): Payer: Self-pay

## 2016-11-01 ENCOUNTER — Encounter: Payer: Self-pay | Admitting: Nurse Practitioner

## 2016-11-01 ENCOUNTER — Ambulatory Visit (INDEPENDENT_AMBULATORY_CARE_PROVIDER_SITE_OTHER): Payer: Self-pay | Admitting: Nurse Practitioner

## 2016-11-01 ENCOUNTER — Emergency Department (HOSPITAL_BASED_OUTPATIENT_CLINIC_OR_DEPARTMENT_OTHER)
Admission: EM | Admit: 2016-11-01 | Discharge: 2016-11-01 | Disposition: A | Discharge status: Home / Self Care | Payer: Self-pay | Attending: Emergency Medicine | Admitting: Emergency Medicine

## 2016-11-01 VITALS — BP 121/77 | HR 89 | Wt 270.2 lb

## 2016-11-01 DIAGNOSIS — E669 Obesity, unspecified: Secondary | ICD-10-CM | POA: Insufficient documentation

## 2016-11-01 DIAGNOSIS — M79605 Pain in left leg: Secondary | ICD-10-CM

## 2016-11-01 DIAGNOSIS — R2242 Localized swelling, mass and lump, left lower limb: Secondary | ICD-10-CM | POA: Insufficient documentation

## 2016-11-01 DIAGNOSIS — Z5181 Encounter for therapeutic drug level monitoring: Secondary | ICD-10-CM

## 2016-11-01 DIAGNOSIS — F17210 Nicotine dependence, cigarettes, uncomplicated: Secondary | ICD-10-CM | POA: Insufficient documentation

## 2016-11-01 DIAGNOSIS — R51 Headache: Secondary | ICD-10-CM

## 2016-11-01 DIAGNOSIS — G40309 Generalized idiopathic epilepsy and epileptic syndromes, not intractable, without status epilepticus: Secondary | ICD-10-CM

## 2016-11-01 DIAGNOSIS — I8393 Asymptomatic varicose veins of bilateral lower extremities: Secondary | ICD-10-CM

## 2016-11-01 DIAGNOSIS — G8929 Other chronic pain: Secondary | ICD-10-CM

## 2016-11-01 DIAGNOSIS — Z79899 Other long term (current) drug therapy: Secondary | ICD-10-CM | POA: Insufficient documentation

## 2016-11-01 DIAGNOSIS — I8392 Asymptomatic varicose veins of left lower extremity: Secondary | ICD-10-CM | POA: Insufficient documentation

## 2016-11-01 MED ORDER — TOPIRAMATE 25 MG PO TABS: 100.0000 mg | ORAL_TABLET | Freq: Two times a day (BID) | ORAL | 11 refills | Status: DC

## 2016-11-01 NOTE — ED Notes (Signed)
Pt returned to nurses station to ask about ultrasound. Pt states "Do ya'll have ultrasound because if not then I'm just wasting my time." EMT told pt she would send the RN in the room to talk to him as soon as she could.

## 2016-11-01 NOTE — ED Notes (Signed)
Pt came up to nurses station upset about wait time and asking "is it shift change because I haven't seen anyone." RN notified.

## 2016-11-01 NOTE — ED Notes (Signed)
Pt educated on the process here in the ED, pt concerned and requesting [place]. Pt notified a provider will see him soon.

## 2016-11-01 NOTE — Patient Instructions (Signed)
Continue Carbatrol at current dose obtains thru pt assist  Continue Topamax at current dose will refill CBC CMP today To monitor adverse effects on Carbatrol Carbamazepine level to monitor for therapeutic versus toxicity Call for any seizure activity Follow-up with primary care regarding varicose veins Follow-up yearly and when necessary

## 2016-11-01 NOTE — ED Triage Notes (Signed)
Patient reports that he has a varicose vein to his right upper leg that appears to be more swollen than usual. The patient reports that it painful and he feels like it is warm to touch

## 2016-11-01 NOTE — Discharge Instructions (Signed)
As we discussed, and wear compression stockings and elevate your feet in the evening to help with swelling. Follow-up with vascular surgery for ongoing care.  See the attached information about varicose veins and possible interventions. Return to the ED for new or worsening symptoms.

## 2016-11-01 NOTE — Progress Notes (Signed)
[facility]  PATIENT: Riel Hirschman DOB: 10-Jul-1980   REASON FOR VISIT: follow-up for generalized epilepsy , headaches HISTORY FROM:patient    HISTORY OF PRESENT ILLNESS:Mr. Mayorquin, 36 year old male returns for yearly followup.   He has a history of generalized seizure disorder and is currently on Carbatrol and Topamax. Last  seizure event on 09/09/2014 while at home, found himself lying on floor. He went to the emergency room on 09/11/14 because of continued headache in the frontal area associated with some blurred vision and photophobia. He was given a migraine cocktail which resolved his headache. CT of the head was unremarkable. Carbamazepine level was 10.5 He is also on Topamax 100 mg twice daily He has had poor medical followup in the past. He does not have insurance.  He gets his Carbatrol through patient assistance and needs to renew. He has what appears to be a varicosity on their upper left leg, good pulses to the leg. No discoloration. He was made aware he needs to follow-up with his primary care. Losing weight would be beneficial. He returns for reevaluation.     REVIEW OF SYSTEMS: Full 14 system review of systems performed and notable only for those listed, all others are neg:  Constitutional: neg  Cardiovascular: neg Ear/Nose/Throat: neg  Skin: Moles Eyes: neg Respiratory: neg Gastroitestinal: neg  Hematology/Lymphatic: neg  Endocrine: neg Musculoskeletal:neg Allergy/Immunology: Seasonal allergies Neurological: History of seizure disorder Psychiatric: neg Sleep : neg   ALLERGIES: No Known Allergies  HOME MEDICATIONS: Outpatient Medications Prior to Visit  Medication Sig Dispense Refill  . CARBATROL 200 MG 12 hr capsule Take 2 capsules by mouth every morning, and 3 capsules at bedtime 50 capsule 2  . topiramate (TOPAMAX) 25 MG tablet Take 4 tablets (100 mg total) by mouth 2 (two) times daily. 240 tablet 11  . triamcinolone cream  (KENALOG) 0.1 % Apply 1 application topically 2 (two) times daily. 30 g 0   No facility-administered medications prior to visit.     PAST MEDICAL HISTORY: Past Medical History:  Diagnosis Date  . Obesity   . Seizures ([place])    last seizure 09/09/14    PAST SURGICAL HISTORY: Past Surgical History:  Procedure Laterality Date  . ELBOW SURGERY    . KNEE SURGERY      FAMILY HISTORY: Family History  Problem Relation Age of Onset  . Cancer Father     SOCIAL HISTORY: Social History   Social History  . Marital status: Single    Spouse name: N/A  . Number of children: 0  . Years of education: 12   Occupational History  . [employer]   Social History Main Topics  . Smoking status: Current Every Day Smoker    Packs/day: 1.00    Types: Cigarettes  . Smokeless tobacco: Never Used  . Alcohol use 0.0 oz/week  . Drug use: No  . Sexual activity: Not on file   Other Topics Concern  . Not on file   Social History Narrative   Patient is single with no children   Patient is left handed   Patient has a high school education   Patient drinks 4 cups daily           PHYSICAL EXAM  Vitals:   11/01/16 1455  BP: 121/77  Pulse: 89  Weight: 270 lb 3.2 oz (122.6 kg)   Body mass index is 41.08 kg/m. General: well developed, obese male seated, in no evident distress  Neurologic Exam  Mental Status:  Awake and fully alert. Oriented to place and time. Follows all commands. Speech and language normal.  Cranial Nerves: Pupils equal, briskly reactive to light. Extraocular movements full without nystagmus. Visual fields full to confrontation. Hearing intact and symmetric to finger snap. Facial sensation intact. Face, tongue, palate move normally and symmetrically. Neck flexion and extension normal.  Motor: Normal bulk and tone. Normal strength in all tested extremity muscles.No focal weakness  Coordination: Rapid alternating movements normal in all extremities.  Finger-to-nose and heel-to-shin performed accurately bilaterally.  Gait and Station: Arises from chair without difficulty. Stance is normal. Gait demonstrates normal stride length and balance . Able to heel, toe and tandem walk without difficulty.  Reflexes: 1+ and symmetric. Toes downgoing.    DIAGNOSTIC DATA (LABS, IMAGING, TESTING) - I reviewed patient records, labs, notes, testing and imaging myself where available.  Lab Results  Component Value Date   WBC 8.8 04/30/2015   HGB 14.5 04/30/2015   HCT 42.8 04/30/2015   MCV 92 04/30/2015   PLT 256 04/30/2015      Component Value Date/Time   NA 144 04/30/2015 1614   K 4.3 04/30/2015 1614   CL 106 04/30/2015 1614   CO2 22 04/30/2015 1614   GLUCOSE 117 (H) 04/30/2015 1614   GLUCOSE 95 09/11/2014 0935   BUN 16 04/30/2015 1614   CREATININE 0.87 04/30/2015 1614   CALCIUM 9.6 04/30/2015 1614   PROT 6.4 04/30/2015 1614   ALBUMIN 4.4 04/30/2015 1614   AST 18 04/30/2015 1614   ALT 23 04/30/2015 1614   ALKPHOS 88 04/30/2015 1614   BILITOT <0.2 04/30/2015 1614   GFRNONAA 113 04/30/2015 1614   GFRAA 131 04/30/2015 1614    ASSESSMENT AND PLAN 36 y.o. year old male  has a past medical history of Obesity and Seizures and migraines. here here to follow-up Last  seizure  09/09/2014.    Continue Carbatrol at current dose obtains thru pt assist given renewal form Continue Topamax at current dose will refill CBC CMP today To monitor adverse effects on Carbatrol Carbamazepine level to monitor for therapeutic versus toxicity Call for any seizure activity Follow-up with primary care regarding varicose veins Follow-up yearly and when necessary Dennie Bible, [facility], [facility], APRN  [facility] [address], [place], [place] [address] [phone] A Raiford Noble she is currently into the big

## 2016-11-01 NOTE — ED Notes (Signed)
Pt verbalizes understanding of d/c instructions and denies any further needs at this time. 

## 2016-11-01 NOTE — Progress Notes (Signed)
Patient given [employer] application for patient assistance with Carbatrol Rx. Instructed him to fill out his portions entirely, bring form back to office for review and completion of provider's sections. Patient verbalized understanding.

## 2016-11-01 NOTE — ED Provider Notes (Signed)
[facility] DEPT [facility] Provider Note   CSN: [id number] Arrival date & time: 11/01/16  1945  By signing my name below, I, Dora Sims, attest that this documentation has been prepared under the direction and in the presence of Quincy Carnes, PA-C. Electronically Signed: Dora Sims, Scribe. 11/01/2016. 9:32 PM.  History   Chief Complaint Chief Complaint  Patient presents with  . Leg Swelling   The history is provided by the patient. No language interpreter was used.    HPI Comments: Luis Alexander is a 36 y.o. male who presents to the Emergency Department complaining of gradually worsening swelling in his distal left upper leg for a few weeks. Patient has varicose veins in his left lower extremity and notes they have been more painful and swollen than usual over the last few weeks. He notes that his varicose veins have worsened acutely in the past after periods of inactivity; he has recently been on vacation at the beach. Patient has elevated the extremity and applied ice without improvement. He is primarily concerned for a blood clot. He denies fevers, chills, or any other associated symptoms.  Past Medical History:  Diagnosis Date  . Obesity   . Seizures ([place])    last seizure 09/09/14    Patient Active Problem List   Diagnosis Date Noted  . Headache 09/28/2013  . 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 Myoclonus 11/22/2012    Past Surgical History:  Procedure Laterality Date  . ELBOW SURGERY    . KNEE SURGERY         Home Medications    Prior to Admission medications   Medication Sig Start Date End Date Taking? Authorizing Provider  CARBATROL 200 MG 12 hr capsule Take 2 capsules by mouth every morning, and 3 capsules at bedtime 12/12/13   Marcial Pacas, MD  topiramate (TOPAMAX) 25 MG tablet Take 4 tablets (100 mg total) by mouth 2 (two) times daily. 11/01/16   Dennie Bible, NP  triamcinolone cream (KENALOG) 0.1 % Apply 1 application topically 2  (two) times daily. 10/09/15   Larene Pickett, PA-C    Family History Family History  Problem Relation Age of Onset  . Cancer Father     Social History Social History  Substance Use Topics  . Smoking status: Current Every Day Smoker    Packs/day: 1.00    Types: Cigarettes  . Smokeless tobacco: Never Used  . Alcohol use 0.0 oz/week     Allergies   Patient has no known allergies.   Review of Systems Review of Systems  Constitutional: Negative for chills and fever.  Cardiovascular: Positive for leg swelling.  Musculoskeletal: Positive for myalgias.  All other systems reviewed and are negative.  Physical Exam Updated Vital Signs BP 109/75 (BP Location: Left Arm)   Pulse 82   Temp 98.4 F (36.9 C) (Oral)   Resp 20   Ht 5\' 10"  (1.778 m)   Wt 270 lb (122.5 kg)   SpO2 97%   BMI 38.74 kg/m   Physical Exam  Constitutional: He is oriented to person, place, and time. He appears well-developed and well-nourished. No distress.  HENT:  Head: Normocephalic and atraumatic.  Mouth/Throat: Oropharynx is clear and moist.  Eyes: Pupils are equal, round, and reactive to light. Conjunctivae and EOM are normal.  Neck: Normal range of motion. Neck supple. No tracheal deviation present.  Cardiovascular: Normal rate, regular rhythm and normal heart sounds.   Pulmonary/Chest: Effort normal and breath sounds normal. No respiratory distress.  Abdominal: Soft. Bowel sounds are normal.  Musculoskeletal: Normal range of motion.  Bilateral lower extremities with varicose veins noted, do appear more pronounced in the left leg; there is no significant calf tenderness or swelling; no overlying erythema or warmth to touch; DP pulses intact; normal cap refill  Neurological: He is alert and oriented to person, place, and time.  Skin: Skin is warm and dry.  Psychiatric: He has a normal mood and affect. His behavior is normal.  Nursing note and vitals reviewed.  ED Treatments / Results  Labs (all  labs ordered are listed, but only abnormal results are displayed) Labs Reviewed - No data to display  EKG  EKG Interpretation None       Radiology US Venous Img Lower Unilateral Left  Result Date: 11/01/2016 CLINICAL DATA:  Enlarged, swollen varicose vein in left upper thigh x2 days EXAM: LEFT LOWER EXTREMITY VENOUS DOPPLER ULTRASOUND TECHNIQUE: Gray-scale sonography with graded compression, as well as color Doppler and duplex ultrasound were performed to evaluate the lower extremity deep venous systems from the level of the common femoral vein and including the common femoral, femoral, profunda femoral, popliteal and calf veins including the posterior tibial, peroneal and gastrocnemius veins when visible. The superficial great saphenous vein was also interrogated. Spectral Doppler was utilized to evaluate flow at rest and with distal augmentation maneuvers in the common femoral, femoral and popliteal veins. COMPARISON:  None. FINDINGS: Contralateral Common Femoral Vein: Respiratory phasicity is normal and symmetric with the symptomatic side. No evidence of thrombus. Normal compressibility. Common Femoral Vein: No evidence of thrombus. Normal compressibility, respiratory phasicity and response to augmentation. Saphenofemoral Junction: No evidence of thrombus. Normal compressibility and flow on color Doppler imaging. Profunda Femoral Vein: No evidence of thrombus. Normal compressibility and flow on color Doppler imaging. Femoral Vein: No evidence of thrombus. Normal compressibility, respiratory phasicity and response to augmentation. Popliteal Vein: No evidence of thrombus. Normal compressibility, respiratory phasicity and response to augmentation. Calf Veins: No evidence of thrombus. Normal compressibility and flow on color Doppler imaging. Superficial Great Saphenous Vein: No evidence of thrombus. Normal compressibility and flow on color Doppler imaging. Venous Reflux:  None. Other Findings:  None.  IMPRESSION: No evidence of DVT within the left lower extremity. Clinical abnormality corresponds to the greater saphenous vein, which remains patent/compressible. Electronically Signed   By: Julian Hy M.D.   On: 11/01/2016 22:23    Procedures Procedures (including critical care time)  DIAGNOSTIC STUDIES: Oxygen Saturation is 97% on RA, normal by my interpretation.    COORDINATION OF CARE: 9:24 PM Discussed treatment plan with pt at bedside and pt agreed to plan.  Medications Ordered in ED Medications - No data to display   Initial Impression / Assessment and Plan / ED Course  I have reviewed the triage vital signs and the nursing notes.  Pertinent labs & imaging results that were available during my care of the patient were reviewed by me and considered in my medical decision making (see chart for details).  36 year old male here with left leg pain. He has varicose veins of both legs, do appear more pronounced in the left leg compared with right. Reports this has increased in size especially potentially over the past few days. Patient has no significant calf tenderness, swelling, overlying erythema, warmth to touch. Both legs are neurovascularly intact. Ultrasound of the left leg obtained, no acute DVT.  Discussed with patient supportive measures for helping with varicose veins including weight loss, exercise, compression stockings, elevate feet  in the evenings. States he feels his varicose veins are starting to interfere with his life and his mobility, will refer to vascular surgery for ongoing evaluation.  Discussed plan with patient, he acknowledged understanding and agreed with plan of care.  Return precautions given for new or worsening symptoms.  Final Clinical Impressions(s) / ED Diagnoses   Final diagnoses:  Left leg pain  Varicose veins of both lower extremities    New Prescriptions Discharge Medication List as of 11/01/2016 11:08 PM     I personally performed the  services described in this documentation, which was scribed in my presence. The recorded information has been reviewed and is accurate.   Larene Pickett, PA-C 11/01/16 2337    Fredia Sorrow, MD 11/06/16 Bosie Helper

## 2016-11-02 ENCOUNTER — Telehealth: Payer: Self-pay | Admitting: *Deleted

## 2016-11-02 LAB — COMPREHENSIVE METABOLIC PANEL
A/G RATIO: 2 (ref 1.2–2.2)
ALBUMIN: 4.4 g/dL (ref 3.5–5.5)
ALT: 22 IU/L (ref 0–44)
AST: 17 IU/L (ref 0–40)
Alkaline Phosphatase: 82 IU/L (ref 39–117)
BUN / CREAT RATIO: 17 (ref 9–20)
BUN: 14 mg/dL (ref 6–20)
Bilirubin Total: <0.2 mg/dL (ref 0.0–1.2)
CO2: 21 mmol/L (ref 20–29)
Calcium: 9.7 mg/dL (ref 8.7–10.2)
Chloride: 107 mmol/L — ABNORMAL HIGH (ref 96–106)
Creatinine, Ser: 0.81 mg/dL (ref 0.76–1.27)
GFR calc non Af Amer: 116 mL/min/{1.73_m2} (ref >59)
GFR, EST AFRICAN AMERICAN: 134 mL/min/{1.73_m2} (ref >59)
GLOBULIN, TOTAL: 2.2 g/dL (ref 1.5–4.5)
Glucose: 103 mg/dL — ABNORMAL HIGH (ref 65–99)
POTASSIUM: 4.3 mmol/L (ref 3.5–5.2)
Sodium: 145 mmol/L — ABNORMAL HIGH (ref 134–144)
TOTAL PROTEIN: 6.6 g/dL (ref 6.0–8.5)

## 2016-11-02 LAB — CBC WITH DIFFERENTIAL/PLATELET
BASOS ABS: 0 10*3/uL (ref 0.0–0.2)
Basos: 0 %
EOS (ABSOLUTE): 0.3 10*3/uL (ref 0.0–0.4)
Eos: 3 %
Hematocrit: 43.8 % (ref 37.5–51.0)
Hemoglobin: 15 g/dL (ref 13.0–17.7)
Immature Grans (Abs): 0 10*3/uL (ref 0.0–0.1)
Immature Granulocytes: 0 %
LYMPHS ABS: 2.8 10*3/uL (ref 0.7–3.1)
Lymphs: 31 %
MCH: 30.8 pg (ref 26.6–33.0)
MCHC: 34.2 g/dL (ref 31.5–35.7)
MCV: 90 fL (ref 79–97)
Monocytes Absolute: 0.8 10*3/uL (ref 0.1–0.9)
Monocytes: 8 %
NEUTROS ABS: 5.2 10*3/uL (ref 1.4–7.0)
Neutrophils: 58 %
PLATELETS: 263 10*3/uL (ref 150–379)
RBC: 4.87 x10E6/uL (ref 4.14–5.80)
RDW: 13.5 % (ref 12.3–15.4)
WBC: 9 10*3/uL (ref 3.4–10.8)

## 2016-11-02 LAB — CARBAMAZEPINE LEVEL, TOTAL: CARBAMAZEPINE LVL: 9.2 ug/mL (ref 4.0–12.0)

## 2016-11-02 NOTE — Telephone Encounter (Signed)
Received call back from patient and informed him his lab results look good. He verbalized understanding, appreciation.

## 2016-11-02 NOTE — Telephone Encounter (Signed)
Attempted to reach patient re: lab results. Voice mailbox full; unable to leave message. Will call later today.

## 2016-11-03 NOTE — Progress Notes (Signed)
I have reviewed and agreed above plan. 

## 2016-11-08 ENCOUNTER — Telehealth: Payer: Self-pay | Admitting: *Deleted

## 2016-11-08 NOTE — Telephone Encounter (Signed)
Faxed completed/signed pt assistance form for rx carbatrol to [facility] by Dr Krista Blue. [phone]. Received confirmation.

## 2017-07-07 ENCOUNTER — Telehealth: Payer: Self-pay | Admitting: *Deleted

## 2017-07-07 MED ORDER — CARBATROL 200 MG PO CP12: ORAL_CAPSULE | ORAL | 3 refills | Status: DC

## 2017-07-07 NOTE — Telephone Encounter (Signed)
Form filled out, prescription printed to send with form.

## 2017-07-08 NOTE — Telephone Encounter (Signed)
Signed and returned to [place], in referrals.

## 2017-07-08 NOTE — Telephone Encounter (Signed)
Sent Signed form [employer] with RX Carbatrol 200 mg telephone [phone] - fax [phone]

## 2017-07-12 NOTE — Telephone Encounter (Signed)
Called patient and left him a voice mail .  I need his proof of income and I need to ask him some additional questions. Before I can complete his application to send. Thanks Hinton Dyer.

## 2017-08-02 NOTE — Telephone Encounter (Signed)
Spoke to patient's mom because I could not get into with Patient . Patient's mother relayed he has a really good job now and in the process of getting his own insurance . Patient does not need patient assistance anymore . Thanks [employer]. Carbatrol 200 mg

## 2017-10-10 ENCOUNTER — Telehealth: Payer: Self-pay | Admitting: *Deleted

## 2017-10-10 NOTE — Telephone Encounter (Signed)
LVM requesting call back re: [employer] form for Carbatrol refills received by this Therapist, sports.

## 2017-10-10 NOTE — Telephone Encounter (Signed)
Received a fax from [facility], re: patient has been approved for prescription drug assistance. He is eligible to receive Carbatrol at no cost for 90 day shipment through 10/11/2018.  Received call back from patient who stated he is working for a company that [employer]. He stated he has looked at insurances but cannot afford any of them. This RN advised him will discuss with D Cox, patient Marketing executive. Advised he will not get a call back if Rx can be faxed to [facility]. He verbalized understanding, appreciation.

## 2017-10-10 NOTE — Telephone Encounter (Signed)
Refill for Carbatrol successfully faxed to [facility].

## 2017-11-01 NOTE — Progress Notes (Signed)
[facility]  PATIENT: Luis Alexander DOB: April 17, 1980   REASON FOR VISIT: follow-up for generalized epilepsy  HISTORY FROM:patient    HISTORY OF PRESENT ILLNESS:Luis Alexander, 37 year old male returns for yearly followup.   He has a history of generalized seizure disorder and is currently on Carbatrol.  He has stopped his Topamax because his headaches improved.  He has been off of that for about 4 months with no return of headaches. Last  seizure event on 09/09/2014 while at home, found himself lying on floor. He went to the emergency room on 09/11/14 because of continued headache in the frontal area associated with some blurred vision and photophobia. He was given a migraine cocktail which resolved his headache. CT of the head was unremarkable. Carbamazepine level was 10.5  He has had poor medical followup in the past. He does not have insurance.  He gets his Carbatrol through patient assistance.Marland Kitchen He returns for reevaluation.     REVIEW OF SYSTEMS: Full 14 system review of systems performed and notable only for those listed, all others are neg:  Constitutional: neg  Cardiovascular: neg Ear/Nose/Throat: neg  Skin: Moles Eyes: neg Respiratory: neg Gastroitestinal: neg  Hematology/Lymphatic: neg  Endocrine: neg Musculoskeletal:neg Allergy/Immunology: Seasonal allergies Neurological: History of seizure disorder Psychiatric: neg Sleep : neg   ALLERGIES: No Known Allergies  HOME MEDICATIONS: Outpatient Medications Prior to Visit  Medication Sig Dispense Refill  . CARBATROL 200 MG 12 hr capsule Take 2 capsules by mouth every morning, and 3 capsules at bedtime 450 capsule 3  . triamcinolone cream (KENALOG) 0.1 % Apply 1 application topically 2 (two) times daily. 30 g 0  . topiramate (TOPAMAX) 25 MG tablet Take 4 tablets (100 mg total) by mouth 2 (two) times daily. (Patient not taking: Reported on 11/02/2017) 240 tablet 11   No facility-administered medications  prior to visit.     PAST MEDICAL HISTORY: Past Medical History:  Diagnosis Date  . Obesity   . Seizures ([place])    last seizure 09/09/14    PAST SURGICAL HISTORY: Past Surgical History:  Procedure Laterality Date  . ELBOW SURGERY    . KNEE SURGERY      FAMILY HISTORY: Family History  Problem Relation Age of Onset  . Cancer Father     SOCIAL HISTORY: Social History   Socioeconomic History  . Marital status: Single    Spouse name: Not on file  . Number of children: 0  . Years of education: 36  . Highest education level: Not on file  Occupational History  . Occupation: Airline pilot: [employer]  . Financial resource strain: Not on file  . Food insecurity:    Worry: Not on file    Inability: Not on file  . Transportation needs:    Medical: Not on file    Non-medical: Not on file  Tobacco Use  . Smoking status: Current Every Day Smoker    Packs/day: 1.00    Types: Cigarettes  . Smokeless tobacco: Never Used  Substance and Sexual Activity  . Alcohol use: Yes    Alcohol/week: 0.0 oz  . Drug use: No  . Sexual activity: Not on file  Lifestyle  . Physical activity:    Days per week: Not on file    Minutes per session: Not on file  . Stress: Not on file  Relationships  . Social connections:    Talks on phone: Not on file    Gets together: Not on  file    Attends religious service: Not on file    Active member of club or organization: Not on file    Attends meetings of clubs or organizations: Not on file    Relationship status: Not on file  . Intimate partner violence:    Fear of current or ex partner: Not on file    Emotionally abused: Not on file    Physically abused: Not on file    Forced sexual activity: Not on file  Other Topics Concern  . Not on file  Social History Narrative   Patient is single with no children   Patient is left handed   Patient has a high school education   Patient drinks 4 cups daily         PHYSICAL EXAM  Vitals:   11/02/17 1455  BP: (!) 135/92  Pulse: 83  Weight: 278 lb 12.8 oz (126.5 kg)  Height: 5\' 10"  (1.778 m)   Body mass index is 40 kg/m. General: well developed, obese male seated, in no evident distress  Neurologic Exam  Mental Status: Awake and fully alert. Oriented to place and time. Follows all commands. Speech and language normal.  Cranial Nerves: Pupils equal, briskly reactive to light. Extraocular movements full without nystagmus. Visual fields full to confrontation. Hearing intact and symmetric to finger snap. Facial sensation intact. Face, tongue, palate move normally and symmetrically. Neck flexion and extension normal.  Motor: Normal bulk and tone. Normal strength in all tested extremity muscles.No focal weakness  Coordination: Rapid alternating movements normal in all extremities. Finger-to-nose and heel-to-shin performed accurately bilaterally.  Gait and Station: Arises from chair without difficulty. Stance is normal. Gait demonstrates normal stride length and balance . Able to heel, toe and tandem walk without difficulty.  Reflexes: 1+ and symmetric. Toes downgoing.    DIAGNOSTIC DATA (LABS, IMAGING, TESTING) - I reviewed patient records, labs, notes, testing and imaging myself where available.  Lab Results  Component Value Date   WBC 9.0 11/01/2016   HGB 15.0 11/01/2016   HCT 43.8 11/01/2016   MCV 90 11/01/2016   PLT 263 11/01/2016      Component Value Date/Time   NA 145 (H) 11/01/2016 1551   K 4.3 11/01/2016 1551   CL 107 (H) 11/01/2016 1551   CO2 21 11/01/2016 1551   GLUCOSE 103 (H) 11/01/2016 1551   GLUCOSE 95 09/11/2014 0935   BUN 14 11/01/2016 1551   CREATININE 0.81 11/01/2016 1551   CALCIUM 9.7 11/01/2016 1551   PROT 6.6 11/01/2016 1551   ALBUMIN 4.4 11/01/2016 1551   AST 17 11/01/2016 1551   ALT 22 11/01/2016 1551   ALKPHOS 82 11/01/2016 1551   BILITOT <0.2 11/01/2016 1551   GFRNONAA 116 11/01/2016 1551    GFRAA 134 11/01/2016 1551    ASSESSMENT AND PLAN 37 y.o. year old male  has a past medical history of Obesity and Seizures and migraines. here here to follow-up Last  seizure  09/09/2014.  His headaches have improved and he has stopped his Topamax.  Continue Carbatrol at current dose obtains thru pt assist  CBC CMP today To monitor adverse effects on Carbatrol Carbamazepine level to monitor for therapeutic versus toxicity Call for any seizure activity Follow-up yearly and when necessary Dennie Bible, [facility], [facility], [facility] [address], [place], [place] [address] [phone]

## 2017-11-02 ENCOUNTER — Encounter: Payer: Self-pay | Admitting: Nurse Practitioner

## 2017-11-02 ENCOUNTER — Ambulatory Visit: Payer: Self-pay | Admitting: Nurse Practitioner

## 2017-11-02 VITALS — BP 135/92 | HR 83 | Ht 70.0 in | Wt 278.8 lb

## 2017-11-02 DIAGNOSIS — G40309 Generalized idiopathic epilepsy and epileptic syndromes, not intractable, without status epilepticus: Secondary | ICD-10-CM

## 2017-11-02 DIAGNOSIS — Z5181 Encounter for therapeutic drug level monitoring: Secondary | ICD-10-CM

## 2017-11-02 NOTE — Progress Notes (Signed)
I have reviewed and agreed above plan. 

## 2017-11-02 NOTE — Patient Instructions (Signed)
Continue Carbatrol at current dose obtains thru pt assist given renewal form CBC CMP today To monitor adverse effects on Carbatrol Carbamazepine level to monitor for therapeutic versus toxicity Call for any seizure activity Follow-up yearly and when necessary

## 2017-11-03 ENCOUNTER — Telehealth: Payer: Self-pay | Admitting: *Deleted

## 2017-11-03 LAB — CBC WITH DIFFERENTIAL/PLATELET
Basophils Absolute: 0 10*3/uL (ref 0.0–0.2)
Basos: 0 %
EOS (ABSOLUTE): 0.2 10*3/uL (ref 0.0–0.4)
Eos: 2 %
Hematocrit: 43.5 % (ref 37.5–51.0)
Hemoglobin: 15 g/dL (ref 13.0–17.7)
Immature Grans (Abs): 0 10*3/uL (ref 0.0–0.1)
Immature Granulocytes: 0 %
Lymphocytes Absolute: 2.7 10*3/uL (ref 0.7–3.1)
Lymphs: 32 %
MCH: 30.6 pg (ref 26.6–33.0)
MCHC: 34.5 g/dL (ref 31.5–35.7)
MCV: 89 fL (ref 79–97)
MONOS ABS: 0.7 10*3/uL (ref 0.1–0.9)
Monocytes: 9 %
Neutrophils Absolute: 4.8 10*3/uL (ref 1.4–7.0)
Neutrophils: 57 %
Platelets: 306 10*3/uL (ref 150–450)
RBC: 4.9 x10E6/uL (ref 4.14–5.80)
RDW: 13.5 % (ref 12.3–15.4)
WBC: 8.5 10*3/uL (ref 3.4–10.8)

## 2017-11-03 LAB — COMPREHENSIVE METABOLIC PANEL
ALBUMIN: 4.9 g/dL (ref 3.5–5.5)
ALT: 26 IU/L (ref 0–44)
AST: 20 IU/L (ref 0–40)
Albumin/Globulin Ratio: 2.1 (ref 1.2–2.2)
Alkaline Phosphatase: 82 IU/L (ref 39–117)
BUN/Creatinine Ratio: 15 (ref 9–20)
BUN: 14 mg/dL (ref 6–20)
Bilirubin Total: 0.3 mg/dL (ref 0.0–1.2)
CALCIUM: 9.7 mg/dL (ref 8.7–10.2)
CHLORIDE: 96 mmol/L (ref 96–106)
CO2: 25 mmol/L (ref 20–29)
CREATININE: 0.95 mg/dL (ref 0.76–1.27)
GFR calc Af Amer: 119 mL/min/{1.73_m2} (ref >59)
GFR calc non Af Amer: 103 mL/min/{1.73_m2} (ref >59)
GLUCOSE: 87 mg/dL (ref 65–99)
Globulin, Total: 2.3 g/dL (ref 1.5–4.5)
Potassium: 4.5 mmol/L (ref 3.5–5.2)
Sodium: 140 mmol/L (ref 134–144)
TOTAL PROTEIN: 7.2 g/dL (ref 6.0–8.5)

## 2017-11-03 LAB — CARBAMAZEPINE LEVEL, TOTAL: Carbamazepine (Tegretol), S: 10.9 ug/mL (ref 4.0–12.0)

## 2017-11-03 NOTE — Telephone Encounter (Signed)
LVM requesting call back re: lab results. If patient calls back, phone staff may inform him his labs are normal.

## 2017-11-04 NOTE — Telephone Encounter (Signed)
Results will be released to patient's my chart; account is active.

## 2018-10-02 ENCOUNTER — Telehealth: Payer: Self-pay | Admitting: Neurology

## 2018-10-02 MED ORDER — CARBATROL 200 MG PO CP12: ORAL_CAPSULE | ORAL | 3 refills | Status: DC

## 2018-10-02 NOTE — Telephone Encounter (Signed)
Pt mom called in and stated a new script for CARBATROL 200 MG 12 hr capsule needs to sent in or phoned in to [facility] Fax - [phone]

## 2018-10-02 NOTE — Telephone Encounter (Signed)
I returned the call to the patient's mother on DPR.  The patient has his yearly follow up scheduled with Dr. Krista Blue on 11/06/2018.  He needs refills called in prior to this appt for his Carbatrol. He is doing well on his current dose with no seizure activity reported.  He is currently using [employer] for his patient assistance program.  A verbal order was called to them (spoke to [facility] - pharmacist) for 90-day x 3 refills.  She has requested the prescription be marked as an expedited refill.

## 2018-11-06 ENCOUNTER — Ambulatory Visit: Payer: Self-pay | Admitting: Nurse Practitioner

## 2018-11-06 ENCOUNTER — Other Ambulatory Visit: Payer: Self-pay

## 2018-11-06 ENCOUNTER — Ambulatory Visit: Payer: Self-pay | Admitting: Neurology

## 2018-11-06 ENCOUNTER — Encounter: Payer: Self-pay | Admitting: Neurology

## 2018-11-06 VITALS — BP 119/83 | HR 75 | Temp 98.2°F | Ht 70.0 in | Wt 254.0 lb

## 2018-11-06 DIAGNOSIS — G40309 Generalized idiopathic epilepsy and epileptic syndromes, not intractable, without status epilepticus: Secondary | ICD-10-CM

## 2018-11-06 MED ORDER — CARBATROL 200 MG PO CP12
ORAL_CAPSULE | ORAL | 3 refills | Status: DC
Start: 2018-11-06 — End: 2019-01-22

## 2018-11-06 NOTE — Progress Notes (Signed)
[facility]  PATIENT: Luis Alexander DOB: 1980-07-03  HISTORY OF PRESENT ILLNESS: [name], 38 year old male returns for yearly followup.    He has a history of generalized seizure disorder and is currently on Carbatrol 200mg  2 tabs in am/ 3tabs in pm .   Last  seizure event on 09/09/2014 while at home, found himself lying on floor. He went to the emergency room on 09/11/14 because of continued headache in the frontal area associated with some blurred vision and photophobia. He was given a migraine cocktail which resolved his headache. CT of the head was unremarkable. Carbamazepine level was 10.5    He has no primary care physician, he is getting his carbatrol from medical assistant program  REVIEW OF SYSTEMS: Full 14 system review of systems performed and notable only for those listed, all others are neg:  As above   ALLERGIES: No Known Allergies  HOME MEDICATIONS: Outpatient Medications Prior to Visit  Medication Sig Dispense Refill  . CARBATROL 200 MG 12 hr capsule Take 2 capsules by mouth every morning, and 3 capsules at bedtime 450 capsule 3  . triamcinolone cream (KENALOG) 0.1 % Apply 1 application topically 2 (two) times daily. 30 g 0   No facility-administered medications prior to visit.     PAST MEDICAL HISTORY: Past Medical History:  Diagnosis Date  . Obesity   . Seizures ([place])    last seizure 09/09/14    PAST SURGICAL HISTORY: Past Surgical History:  Procedure Laterality Date  . ELBOW SURGERY    . KNEE SURGERY      FAMILY HISTORY: Family History  Problem Relation Age of Onset  . Cancer Father     SOCIAL HISTORY: Social History   Socioeconomic History  . Marital status: Single    Spouse name: Not on file  . Number of children: 0  . Years of education: 67  . Highest education level: Not on file  Occupational History  . Occupation: Airline pilot: [employer]  . Financial resource strain: Not on  file  . Food insecurity    Worry: Not on file    Inability: Not on file  . Transportation needs    Medical: Not on file    Non-medical: Not on file  Tobacco Use  . Smoking status: Current Every Day Smoker    Packs/day: 1.00    Types: Cigarettes  . Smokeless tobacco: Never Used  Substance and Sexual Activity  . Alcohol use: Yes    Alcohol/week: 0.0 standard drinks  . Drug use: No  . Sexual activity: Not on file  Lifestyle  . Physical activity    Days per week: Not on file    Minutes per session: Not on file  . Stress: Not on file  Relationships  . Social Herbalist on phone: Not on file    Gets together: Not on file    Attends religious service: Not on file    Active member of club or organization: Not on file    Attends meetings of clubs or organizations: Not on file    Relationship status: Not on file  . Intimate partner violence    Fear of current or ex partner: Not on file    Emotionally abused: Not on file    Physically abused: Not on file    Forced sexual activity: Not on file  Other Topics Concern  . Not on file  Social History Narrative   Patient  is single with no children   Patient is left handed   Patient has a high school education   Patient drinks 4 cups daily        PHYSICAL EXAM  Vitals:   11/06/18 1306  BP: 119/83  Pulse: 75  Temp: 98.2 F (36.8 C)  Weight: 254 lb (115.2 kg)  Height: 5\' 10"  (1.778 m)   Body mass index is 36.45 kg/m. General: well developed, obese male seated, in no evident distress  Neurologic Exam  Mental Status: Awake and fully alert. Oriented to place and time. Follows all commands. Speech and language normal.  Cranial Nerves: Pupils equal, briskly reactive to light. Extraocular movements full without nystagmus. Visual fields full to confrontation. Hearing intact and symmetric to finger snap. Facial sensation intact. Face, tongue, palate move normally and symmetrically. Neck flexion and extension normal.   Motor: Normal bulk and tone. Normal strength in all tested extremity muscles.No focal weakness  Coordination: Rapid alternating movements normal in all extremities. Finger-to-nose and heel-to-shin performed accurately bilaterally.  Gait and Station: Arises from chair without difficulty. Stance is normal. Gait demonstrates normal stride length and balance . Able to heel, toe and tandem walk without difficulty.  Reflexes: 1+ and symmetric. Toes downgoing.    DIAGNOSTIC DATA (LABS, IMAGING, TESTING) - I reviewed patient records, labs, notes, testing and imaging myself where available.  Lab Results  Component Value Date   WBC 8.5 11/02/2017   HGB 15.0 11/02/2017   HCT 43.5 11/02/2017   MCV 89 11/02/2017   PLT 306 11/02/2017      Component Value Date/Time   NA 140 11/02/2017 1539   K 4.5 11/02/2017 1539   CL 96 11/02/2017 1539   CO2 25 11/02/2017 1539   GLUCOSE 87 11/02/2017 1539   GLUCOSE 95 09/11/2014 0935   BUN 14 11/02/2017 1539   CREATININE 0.95 11/02/2017 1539   CALCIUM 9.7 11/02/2017 1539   PROT 7.2 11/02/2017 1539   ALBUMIN 4.9 11/02/2017 1539   AST 20 11/02/2017 1539   ALT 26 11/02/2017 1539   ALKPHOS 82 11/02/2017 1539   BILITOT 0.3 11/02/2017 1539   GFRNONAA 103 11/02/2017 1539   GFRAA 119 11/02/2017 1539    ASSESSMENT AND PLAN 38 y.o. year old male    Seizure Chronic migraine headache  Last seizure was on September 09 2014.  Keep Carbatrol xr 200mg  2 tabs in the morning and 3 tabs at night.   Marcial Pacas, M.D. Ph.D.  [facility] [place], [place] [address] Phone: [phone] Fax:      [phone]

## 2019-01-22 ENCOUNTER — Telehealth: Payer: Self-pay | Admitting: Neurology

## 2019-01-22 ENCOUNTER — Other Ambulatory Visit: Payer: Self-pay | Admitting: Neurology

## 2019-01-22 MED ORDER — CARBATROL 200 MG PO CP12: ORAL_CAPSULE | ORAL | 3 refills | Status: AC

## 2019-01-22 NOTE — Telephone Encounter (Signed)
Pt's mother called stating that a new prescription needs to be faxed to [facility] for the pt's CARBATROL 200 MG 12 hr capsule This is considered a new case and a new one is to be sent. They are not needing the one that was done back in the summer, a new one needs to be sent. Please advise. [employer] Phone number# [phone] Fax# [phone]

## 2019-01-22 NOTE — Telephone Encounter (Signed)
RX refill has been completed and I have faxed to the number below for the patient. Received a fax confirmation.

## 2019-11-07 ENCOUNTER — Ambulatory Visit: Payer: Self-pay | Admitting: Neurology

## 2019-11-07 ENCOUNTER — Encounter: Payer: Self-pay | Admitting: Neurology

## 2019-11-07 NOTE — Progress Notes (Deleted)
PATIENT: Luis Alexander DOB: 11-Jun-1980  REASON FOR VISIT: follow up HISTORY FROM: patient  HISTORY OF PRESENT ILLNESS: Today 11/07/19  HISTORY  Luis Alexander, 39 year old male returns for yearly followup.    He has a history of generalized seizure disorder and is currently on Carbatrol 200mg  2 tabs in am/ 3tabs in pm .   Last  seizure event on 09/09/2014 while at home, found himself lying on floor. He went to the emergency room on 09/11/14 because of continued headache in the frontal area associated with some blurred vision and photophobia. He was given a migraine cocktail which resolved his headache. CT of the head was unremarkable. Carbamazepine level was 10.5    He has no primary care physician, he is getting his carbatrol from medical assistant program  Update November 07, 2019 SS:   REVIEW OF SYSTEMS: Out of a complete 14 system review of symptoms, the patient complains only of the following symptoms, and all other reviewed systems are negative.  ALLERGIES: No Known Allergies  HOME MEDICATIONS: Outpatient Medications Prior to Visit  Medication Sig Dispense Refill  . CARBATROL 200 MG 12 hr capsule Take 2 capsules by mouth every morning, and 3 capsules at bedtime 450 capsule 3   No facility-administered medications prior to visit.    PAST MEDICAL HISTORY: Past Medical History:  Diagnosis Date  . Obesity   . Seizures ([place])    last seizure 09/09/14    PAST SURGICAL HISTORY: Past Surgical History:  Procedure Laterality Date  . ELBOW SURGERY    . KNEE SURGERY      FAMILY HISTORY: Family History  Problem Relation Age of Onset  . Cancer Father     SOCIAL HISTORY: Social History   Socioeconomic History  . Marital status: Single    Spouse name: Not on file  . Number of children: 0  . Years of education: 70  . Highest education level: Not on file  Occupational History  . Occupation: Airline pilot: [employer]  Tobacco Use  . Smoking status:  Current Every Day Smoker    Packs/day: 1.00    Types: Cigarettes  . Smokeless tobacco: Never Used  Substance and Sexual Activity  . Alcohol use: Yes    Alcohol/week: 0.0 standard drinks  . Drug use: No  . Sexual activity: Not on file  Other Topics Concern  . Not on file  Social History Narrative   Patient is single with no children   Patient is left handed   Patient has a high school education   Patient drinks 4 cups daily      Social Determinants of Health   Financial Resource Strain:   . Difficulty of Paying Living Expenses:   Food Insecurity:   . Worried About Charity fundraiser in the Last Year:   . Arboriculturist in the Last Year:   Transportation Needs:   . Film/video editor (Medical):   Marland Kitchen Lack of Transportation (Non-Medical):   Physical Activity:   . Days of Exercise per Week:   . Minutes of Exercise per Session:   Stress:   . Feeling of Stress :   Social Connections:   . Frequency of Communication with Friends and Family:   . Frequency of Social Gatherings with Friends and Family:   . Attends Religious Services:   . Active Member of Clubs or Organizations:   . Attends Archivist Meetings:   Marland Kitchen Marital Status:   Intimate Production manager  Violence:   . Fear of Current or Ex-Partner:   . Emotionally Abused:   Marland Kitchen Physically Abused:   . Sexually Abused:       PHYSICAL EXAM  There were no vitals filed for this visit. There is no height or weight on file to calculate BMI.  Generalized: Well developed, in no acute distress   Neurological examination  Mentation: Alert oriented to time, place, history taking. Follows all commands speech and language fluent Cranial nerve II-XII: Pupils were equal round reactive to light. Extraocular movements were full, visual field were full on confrontational test. Facial sensation and strength were normal. Uvula tongue midline. Head turning and shoulder shrug  were normal and symmetric. Motor: The motor testing reveals  5 over 5 strength of all 4 extremities. Good symmetric motor tone is noted throughout.  Sensory: Sensory testing is intact to soft touch on all 4 extremities. No evidence of extinction is noted.  Coordination: Cerebellar testing reveals good finger-nose-finger and heel-to-shin bilaterally.  Gait and station: Gait is normal. Tandem gait is normal. Romberg is negative. No drift is seen.  Reflexes: Deep tendon reflexes are symmetric and normal bilaterally.   DIAGNOSTIC DATA (LABS, IMAGING, TESTING) - I reviewed patient records, labs, notes, testing and imaging myself where available.  Lab Results  Component Value Date   WBC 8.5 11/02/2017   HGB 15.0 11/02/2017   HCT 43.5 11/02/2017   MCV 89 11/02/2017   PLT 306 11/02/2017      Component Value Date/Time   NA 140 11/02/2017 1539   K 4.5 11/02/2017 1539   CL 96 11/02/2017 1539   CO2 25 11/02/2017 1539   GLUCOSE 87 11/02/2017 1539   GLUCOSE 95 09/11/2014 0935   BUN 14 11/02/2017 1539   CREATININE 0.95 11/02/2017 1539   CALCIUM 9.7 11/02/2017 1539   PROT 7.2 11/02/2017 1539   ALBUMIN 4.9 11/02/2017 1539   AST 20 11/02/2017 1539   ALT 26 11/02/2017 1539   ALKPHOS 82 11/02/2017 1539   BILITOT 0.3 11/02/2017 1539   GFRNONAA 103 11/02/2017 1539   GFRAA 119 11/02/2017 1539   No results found for: CHOL, HDL, LDLCALC, LDLDIRECT, TRIG, CHOLHDL No results found for: HGBA1C No results found for: VITAMINB12 No results found for: TSH    ASSESSMENT AND PLAN 39 y.o. year old male  has a past medical history of Obesity and Seizures ([place]). here with:  1.  Seizure 2.  Chronic migraine headache -Last seizure September 09, 2014 -Continue Carbatrol XR 200 mg, 2 tablets am/3 tablets pm   I spent 15 minutes with the patient. 50% of this time was spent   Butler Denmark, [place], DNP 11/07/2019, 5:45 AM [facility] [address], [place], [place] [address] [phone]

## 2019-12-12 ENCOUNTER — Telehealth: Payer: Self-pay

## 2019-12-12 NOTE — Telephone Encounter (Signed)
Pt left a VM reporting that he spoke with Debbie in billing and is now ready to schedule an appt.

## 2019-12-26 ENCOUNTER — Ambulatory Visit: Payer: Self-pay | Admitting: Neurology

## 2020-02-19 ENCOUNTER — Telehealth: Payer: Self-pay | Admitting: Neurology

## 2020-02-27 ENCOUNTER — Ambulatory Visit: Payer: Self-pay | Admitting: Neurology

## 2020-05-05 ENCOUNTER — Encounter: Payer: Self-pay | Admitting: Neurology

## 2020-05-05 ENCOUNTER — Ambulatory Visit: Payer: Self-pay | Admitting: Neurology

## 2020-05-05 NOTE — Progress Notes (Deleted)
PATIENT: Luis Alexander DOB: [birth year]  REASON FOR VISIT: follow up HISTORY FROM: patient  HISTORY OF PRESENT ILLNESS: Today 05/05/20  HISTORY  Luis Alexander, 40 year old male returns for yearly followup.    He has a history of generalized seizure disorder and is currently on Carbatrol 200mg  2 tabs in am/ 3tabs in pm .   Last  seizure event on 09/09/2014 while at home, found himself lying on floor. He went to the emergency room on 09/11/14 because of continued headache in the frontal area associated with some blurred vision and photophobia. He was given a migraine cocktail which resolved his headache. CT of the head was unremarkable. Carbamazepine level was 10.5    He has no primary care physician, he is getting his carbatrol from medical assistant program  Update May 05, 2020 SS: Was in the ER April 29, 2020, had a witnessed seizure in the shower, he had been seizure-free for 6 years, had decided to stop his medications for 2 weeks when the seizure occurred.  CT head was performed in the ER, showed no acute intercranial abnormality, CT cervical spine was normal. ETOH level negative, CBC CMP unremarkable,  REVIEW OF SYSTEMS: Out of a complete 14 system review of symptoms, the patient complains only of the following symptoms, and all other reviewed systems are negative.  ALLERGIES: No Known Allergies  HOME MEDICATIONS: Outpatient Medications Prior to Visit  Medication Sig Dispense Refill  . CARBATROL 200 MG 12 hr capsule Take 2 capsules by mouth every morning, and 3 capsules at bedtime 450 capsule 3   No facility-administered medications prior to visit.    PAST MEDICAL HISTORY: Past Medical History:  Diagnosis Date  . Obesity   . Seizures ([place])    last seizure 09/09/14    PAST SURGICAL HISTORY: Past Surgical History:  Procedure Laterality Date  . ELBOW SURGERY    . KNEE SURGERY      FAMILY HISTORY: Family History  Problem Relation Age of Onset  . Cancer  Father     SOCIAL HISTORY: Social History   Socioeconomic History  . Marital status: Single    Spouse name: Not on file  . Number of children: 0  . Years of education: 12  . Highest education level: Not on file  Occupational History  . Occupation: Airline pilot: [employer]  Tobacco Use  . Smoking status: Current Every Day Smoker    Packs/day: 1.00    Types: Cigarettes  . Smokeless tobacco: Never Used  Substance and Sexual Activity  . Alcohol use: Yes    Alcohol/week: 0.0 standard drinks  . Drug use: No  . Sexual activity: Not on file  Other Topics Concern  . Not on file  Social History Narrative   Patient is single with no children   Patient is left handed   Patient has a high school education   Patient drinks 4 cups daily      Social Determinants of Health   Financial Resource Strain: Not on file  Food Insecurity: Not on file  Transportation Needs: Not on file  Physical Activity: Not on file  Stress: Not on file  Social Connections: Not on file  Intimate Partner Violence: Not on file      PHYSICAL EXAM  There were no vitals filed for this visit. There is no height or weight on file to calculate BMI.  Generalized: Well developed, in no acute distress   Neurological examination  Mentation: Alert oriented to time,  place, history taking. Follows all commands speech and language fluent Cranial nerve II-XII: Pupils were equal round reactive to light. Extraocular movements were full, visual field were full on confrontational test. Facial sensation and strength were normal. Uvula tongue midline. Head turning and shoulder shrug  were normal and symmetric. Motor: The motor testing reveals 5 over 5 strength of all 4 extremities. Good symmetric motor tone is noted throughout.  Sensory: Sensory testing is intact to soft touch on all 4 extremities. No evidence of extinction is noted.  Coordination: Cerebellar testing reveals good finger-nose-finger and  heel-to-shin bilaterally.  Gait and station: Gait is normal. Tandem gait is normal. Romberg is negative. No drift is seen.  Reflexes: Deep tendon reflexes are symmetric and normal bilaterally.   DIAGNOSTIC DATA (LABS, IMAGING, TESTING) - I reviewed patient records, labs, notes, testing and imaging myself where available.  Lab Results  Component Value Date   WBC 8.5 11/02/2017   HGB 15.0 11/02/2017   HCT 43.5 11/02/2017   MCV 89 11/02/2017   PLT 306 11/02/2017      Component Value Date/Time   NA 140 11/02/2017 1539   K 4.5 11/02/2017 1539   CL 96 11/02/2017 1539   CO2 25 11/02/2017 1539   GLUCOSE 87 11/02/2017 1539   GLUCOSE 95 09/11/2014 0935   BUN 14 11/02/2017 1539   CREATININE 0.95 11/02/2017 1539   CALCIUM 9.7 11/02/2017 1539   PROT 7.2 11/02/2017 1539   ALBUMIN 4.9 11/02/2017 1539   AST 20 11/02/2017 1539   ALT 26 11/02/2017 1539   ALKPHOS 82 11/02/2017 1539   BILITOT 0.3 11/02/2017 1539   GFRNONAA 103 11/02/2017 1539   GFRAA 119 11/02/2017 1539   No results found for: CHOL, HDL, LDLCALC, LDLDIRECT, TRIG, CHOLHDL No results found for: HGBA1C No results found for: VITAMINB12 No results found for: TSH    ASSESSMENT AND PLAN 41 y.o. year old male  has a past medical history of Obesity and Seizures ([place]). here with:  1.  Seizure 2.  Chronic migraine headache -Seizure April 29, 2020, after weaning off Carbatrol for 2 weeks   I spent 15 minutes with the patient. 50% of this time was spent   Butler Denmark, [place], DNP 05/05/2020, 7:42 AM [facility] [address] [place],  [address] [phone]

## 2020-05-27 NOTE — Telephone Encounter (Signed)
Late entry: r/s due to provider out of office.
